# Patient Record
Sex: Female | Born: 1945
Health system: Southern US, Community
[De-identification: ages and names within clinical notes are randomized; demographics above are authoritative.]

## PROBLEM LIST (undated history)

## (undated) DIAGNOSIS — E039 Hypothyroidism, unspecified: Secondary | ICD-10-CM

## (undated) DIAGNOSIS — I10 Essential (primary) hypertension: Secondary | ICD-10-CM

## (undated) DIAGNOSIS — M1990 Unspecified osteoarthritis, unspecified site: Secondary | ICD-10-CM

## (undated) DIAGNOSIS — K219 Gastro-esophageal reflux disease without esophagitis: Secondary | ICD-10-CM

## (undated) HISTORY — PX: FOOT SURGERY: SHX648

## (undated) HISTORY — DX: Essential (primary) hypertension: I10

## (undated) HISTORY — PX: ABDOMINAL HYSTERECTOMY: SHX81

## (undated) HISTORY — PX: BACK SURGERY: SHX140

## (undated) HISTORY — PX: BREAST SURGERY: SHX581

---

## 2007-09-27 ENCOUNTER — Other Ambulatory Visit: Admission: RE | Admit: 2007-09-27 | Discharge: 2007-09-27 | Payer: Self-pay | Admitting: Family Medicine

## 2010-04-19 ENCOUNTER — Encounter: Admission: RE | Admit: 2010-04-19 | Discharge: 2010-04-19 | Payer: Self-pay | Admitting: Family Medicine

## 2010-07-26 ENCOUNTER — Ambulatory Visit (HOSPITAL_BASED_OUTPATIENT_CLINIC_OR_DEPARTMENT_OTHER): Admission: RE | Admit: 2010-07-26 | Discharge: 2010-07-26 | Payer: Self-pay | Admitting: Surgery

## 2010-11-23 LAB — BASIC METABOLIC PANEL
Calcium: 9.2 mg/dL (ref 8.4–10.5)
Creatinine, Ser: 0.99 mg/dL (ref 0.4–1.2)
Glucose, Bld: 105 mg/dL — ABNORMAL HIGH (ref 70–99)

## 2011-04-18 ENCOUNTER — Ambulatory Visit
Admission: RE | Admit: 2011-04-18 | Discharge: 2011-04-18 | Disposition: A | Discharge status: Home / Self Care | Payer: Self-pay | Source: Ambulatory Visit | Attending: Orthopedic Surgery | Admitting: Orthopedic Surgery

## 2011-04-18 ENCOUNTER — Other Ambulatory Visit: Payer: Self-pay | Admitting: Orthopedic Surgery

## 2011-04-18 DIAGNOSIS — M545 Low back pain, unspecified: Secondary | ICD-10-CM

## 2012-04-12 ENCOUNTER — Other Ambulatory Visit (HOSPITAL_BASED_OUTPATIENT_CLINIC_OR_DEPARTMENT_OTHER): Payer: Self-pay | Admitting: Family Medicine

## 2012-04-12 DIAGNOSIS — E041 Nontoxic single thyroid nodule: Secondary | ICD-10-CM

## 2012-04-26 ENCOUNTER — Ambulatory Visit (HOSPITAL_BASED_OUTPATIENT_CLINIC_OR_DEPARTMENT_OTHER)
Admission: RE | Admit: 2012-04-26 | Discharge: 2012-04-26 | Disposition: A | Discharge status: Home / Self Care | Payer: Medicare Other | Source: Ambulatory Visit | Attending: Family Medicine | Admitting: Family Medicine

## 2012-04-26 DIAGNOSIS — E041 Nontoxic single thyroid nodule: Secondary | ICD-10-CM | POA: Insufficient documentation

## 2012-04-30 ENCOUNTER — Other Ambulatory Visit: Payer: Self-pay | Admitting: Physician Assistant

## 2012-04-30 DIAGNOSIS — E042 Nontoxic multinodular goiter: Secondary | ICD-10-CM

## 2012-05-02 ENCOUNTER — Other Ambulatory Visit: Payer: Self-pay | Admitting: Physician Assistant

## 2012-05-02 ENCOUNTER — Other Ambulatory Visit (HOSPITAL_COMMUNITY)
Admission: RE | Admit: 2012-05-02 | Discharge: 2012-05-02 | Disposition: A | Discharge status: Home / Self Care | Payer: Medicare Other | Source: Ambulatory Visit | Attending: Diagnostic Radiology | Admitting: Diagnostic Radiology

## 2012-05-02 ENCOUNTER — Ambulatory Visit
Admission: RE | Admit: 2012-05-02 | Discharge: 2012-05-02 | Disposition: A | Discharge status: Home / Self Care | Payer: Medicare Other | Source: Ambulatory Visit | Attending: Physician Assistant | Admitting: Physician Assistant

## 2012-05-02 DIAGNOSIS — E042 Nontoxic multinodular goiter: Secondary | ICD-10-CM

## 2012-05-02 DIAGNOSIS — E049 Nontoxic goiter, unspecified: Secondary | ICD-10-CM | POA: Insufficient documentation

## 2012-05-21 ENCOUNTER — Encounter (HOSPITAL_COMMUNITY): Payer: Self-pay | Admitting: Pharmacy Technician

## 2012-05-28 ENCOUNTER — Other Ambulatory Visit: Payer: Self-pay | Admitting: Neurosurgery

## 2012-05-28 NOTE — Pre-Procedure Instructions (Signed)
19 Sheila Boyd  05/28/2012   Your procedure is scheduled on:  Monday June 04, 2012 at 0730 AM  Report to [facility] at 0530 AM.  Call this number if you have problems the morning of surgery: [phone]   Remember:   Do not eat food or drink:After Midnight.Sunday      Take these medicines the morning of surgery with A SIP OF WATER: Prevacid[Lansoprazole], Synthroid, and Acetaminophen [ Tylenol] if needed.   Do not wear jewelry, make-up or nail polish.  Do not wear lotions, powders, or perfumes. You may wear deodorant.  Do not shave 48 hours prior to surgery. Men may shave face and neck.  Do not bring valuables to the hospital.  Contacts, dentures or bridgework may not be worn into surgery.  Leave suitcase in the car. After surgery it may be brought to your room.  For patients admitted to the hospital, checkout time is 11:00 AM the day of discharge.   Patients discharged the day of surgery will not be allowed to drive home.    Special Instructions: CHG Shower Use Special Wash: 1/2 bottle night before surgery and 1/2 bottle morning of surgery.   Please read over the following fact sheets that you were given: Pain Booklet, Coughing and Deep Breathing, MRSA Information and Surgical Site Infection Prevention

## 2012-05-29 ENCOUNTER — Encounter (HOSPITAL_COMMUNITY)
Admission: RE | Admit: 2012-05-29 | Discharge: 2012-05-29 | Disposition: A | Discharge status: Home / Self Care | Payer: Medicare Other | Source: Ambulatory Visit | Attending: Neurosurgery | Admitting: Neurosurgery

## 2012-05-29 ENCOUNTER — Encounter (HOSPITAL_COMMUNITY): Payer: Self-pay

## 2012-05-29 HISTORY — DX: Hypothyroidism, unspecified: E03.9

## 2012-05-29 HISTORY — DX: Gastro-esophageal reflux disease without esophagitis: K21.9

## 2012-05-29 HISTORY — DX: Unspecified osteoarthritis, unspecified site: M19.90

## 2012-05-29 LAB — BASIC METABOLIC PANEL
CO2: 31 mEq/L (ref 19–32)
Calcium: 9.9 mg/dL (ref 8.4–10.5)
Chloride: 101 mEq/L (ref 96–112)
Glucose, Bld: 100 mg/dL — ABNORMAL HIGH (ref 70–99)
Sodium: 140 mEq/L (ref 135–145)

## 2012-05-29 LAB — TYPE AND SCREEN: ABO/RH(D): O POS

## 2012-05-29 LAB — CBC
HCT: 39.4 % (ref 36.0–46.0)
RBC: 4.58 MIL/uL (ref 3.87–5.11)
RDW: 14.2 % (ref 11.5–15.5)

## 2012-05-29 LAB — PROTIME-INR: Prothrombin Time: 13.8 seconds (ref 11.6–15.2)

## 2012-05-29 NOTE — Progress Notes (Signed)
Abnormal EKG on preadmission.  Jaynie Collins, Anesthesia PA, notified and given chart to review.

## 2012-05-30 NOTE — Consult Note (Signed)
Anesthesia chart review: Patient is a 66 year old female scheduled for L4-5 posterior lumbar interbody fusion by Dr. Wynetta Emery on 06/11/12 (just rescheduled from 06/04/2012 per office). History includes nonsmoker, HTN on HCTZ, GERD, arthritis, hypothyroidism, hysterectomy, breast and foot surgeries.  BMI 25.  PCP is listed as Johna Sheriff, PA-C [phone]).  Labs noted.  CXR on 05/29/12 showed no active disease. Soft tissue nodular prominence in the upper mediastinum most likely due to multinodular goiter.   EKG on 05/29/12 showed SB, anterolateral T wave abnormality (consider ischemia), inferior T wave abnormality (consider ischemia).  She had prior T wave inversion in V1-4, but the inferior T wave abnormality appears new since her EKG from 07/21/10 (pre-op for excision of right breast mass).  I called and spoke with Ms. Couchman.  She denied known history of CAD/MI/CHF.  She has no history of DM.  She denies history of chest pain or SOB at rest.  She does have dyspnea with moderate activity which is not new.  Her activity has been somewhat limited due to chronic back pain that has now progressed to include leg pain.  She has not been able to do recent strenuous activity due to back and leg pain.  I reviewed with Anesthesiologist Dr. Katrinka Blazing.  If patient remains asymptomatic, then anticipate she can proceed as planned.  Shonna Chock, PA-C

## 2012-05-31 ENCOUNTER — Encounter (HOSPITAL_COMMUNITY): Payer: Self-pay | Admitting: Vascular Surgery

## 2012-06-08 NOTE — Progress Notes (Signed)
NOTIFIED MS. [place] OF TIME CHANGE. INSTRUCTED PATIENT TO ARRIVE AT 1020 AM.

## 2012-06-10 MED ORDER — VANCOMYCIN HCL IN DEXTROSE 1-5 GM/200ML-% IV SOLN
1000.0000 mg | INTRAVENOUS | Status: AC
  Administered 2012-06-11: 1000 mg via INTRAVENOUS

## 2012-06-10 MED ORDER — DEXAMETHASONE SODIUM PHOSPHATE 10 MG/ML IJ SOLN
10.0000 mg | INTRAMUSCULAR | Status: AC
  Administered 2012-06-11: 10 mg via INTRAVENOUS

## 2012-06-11 ENCOUNTER — Inpatient Hospital Stay (HOSPITAL_COMMUNITY)
Admission: RE | Admit: 2012-06-11 | Discharge: 2012-06-14 | DRG: 460 | Disposition: A | Discharge status: Home / Self Care | Payer: Medicare Other | Source: Ambulatory Visit | Attending: Neurosurgery | Admitting: Neurosurgery

## 2012-06-11 ENCOUNTER — Encounter (HOSPITAL_COMMUNITY): Payer: Self-pay | Admitting: Vascular Surgery

## 2012-06-11 ENCOUNTER — Inpatient Hospital Stay (HOSPITAL_COMMUNITY): Payer: Medicare Other | Admitting: Vascular Surgery

## 2012-06-11 ENCOUNTER — Encounter (HOSPITAL_COMMUNITY): Payer: Self-pay | Admitting: *Deleted

## 2012-06-11 ENCOUNTER — Encounter (HOSPITAL_COMMUNITY)
Admission: RE | Disposition: A | Discharge status: Home / Self Care | Payer: Self-pay | Source: Ambulatory Visit | Attending: Neurosurgery

## 2012-06-11 ENCOUNTER — Inpatient Hospital Stay (HOSPITAL_COMMUNITY): Payer: Medicare Other

## 2012-06-11 DIAGNOSIS — I10 Essential (primary) hypertension: Secondary | ICD-10-CM | POA: Diagnosis present

## 2012-06-11 DIAGNOSIS — Z880 Allergy status to penicillin: Secondary | ICD-10-CM

## 2012-06-11 DIAGNOSIS — K219 Gastro-esophageal reflux disease without esophagitis: Secondary | ICD-10-CM | POA: Diagnosis present

## 2012-06-11 DIAGNOSIS — E039 Hypothyroidism, unspecified: Secondary | ICD-10-CM | POA: Diagnosis present

## 2012-06-11 DIAGNOSIS — Z79899 Other long term (current) drug therapy: Secondary | ICD-10-CM

## 2012-06-11 DIAGNOSIS — Q762 Congenital spondylolisthesis: Principal | ICD-10-CM

## 2012-06-11 DIAGNOSIS — Z23 Encounter for immunization: Secondary | ICD-10-CM

## 2012-06-11 DIAGNOSIS — Z01812 Encounter for preprocedural laboratory examination: Secondary | ICD-10-CM

## 2012-06-11 HISTORY — PX: POSTERIOR FUSION LUMBAR SPINE: SUR632

## 2012-06-11 SURGERY — POSTERIOR LUMBAR FUSION 1 LEVEL
Anesthesia: General | Site: Back | Wound class: Clean

## 2012-06-11 MED ORDER — BACITRACIN 50000 UNITS IM SOLR
INTRAMUSCULAR | Status: AC
  Filled 2012-06-11: qty 1

## 2012-06-11 MED ORDER — CYCLOBENZAPRINE HCL 10 MG PO TABS
10.0000 mg | ORAL_TABLET | Freq: Three times a day (TID) | ORAL | Status: DC | PRN
  Administered 2012-06-12 – 2012-06-14 (×2): 10 mg via ORAL
  Filled 2012-06-11 (×2): qty 1

## 2012-06-11 MED ORDER — SODIUM CHLORIDE 0.9 % IV SOLN
INTRAVENOUS | Status: AC
  Filled 2012-06-11: qty 500

## 2012-06-11 MED ORDER — HYDROMORPHONE HCL PF 1 MG/ML IJ SOLN
0.2500 mg | INTRAMUSCULAR | Status: DC | PRN
  Administered 2012-06-11: 0.5 mg via INTRAVENOUS

## 2012-06-11 MED ORDER — LEVOTHYROXINE SODIUM 25 MCG PO TABS
25.0000 ug | ORAL_TABLET | Freq: Every day | ORAL | Status: DC
  Administered 2012-06-12 – 2012-06-14 (×3): 25 ug via ORAL
  Filled 2012-06-11 (×3): qty 1

## 2012-06-11 MED ORDER — HYDROMORPHONE HCL PF 1 MG/ML IJ SOLN
INTRAMUSCULAR | Status: AC
  Filled 2012-06-11: qty 1

## 2012-06-11 MED ORDER — SODIUM CHLORIDE 0.9 % IR SOLN
Status: DC | PRN
  Administered 2012-06-11: 13:00:00

## 2012-06-11 MED ORDER — HEPARIN SODIUM (PORCINE) 1000 UNIT/ML IJ SOLN
INTRAMUSCULAR | Status: AC
  Filled 2012-06-11: qty 1

## 2012-06-11 MED ORDER — ACETAMINOPHEN 325 MG PO TABS
650.0000 mg | ORAL_TABLET | ORAL | Status: DC | PRN
  Administered 2012-06-13 – 2012-06-14 (×3): 650 mg via ORAL
  Filled 2012-06-11 (×3): qty 2

## 2012-06-11 MED ORDER — ROCURONIUM BROMIDE 100 MG/10ML IV SOLN
INTRAVENOUS | Status: DC | PRN
  Administered 2012-06-11: 50 mg via INTRAVENOUS
  Administered 2012-06-11 (×2): 10 mg via INTRAVENOUS
  Administered 2012-06-11: 5 mg via INTRAVENOUS

## 2012-06-11 MED ORDER — VANCOMYCIN HCL IN DEXTROSE 1-5 GM/200ML-% IV SOLN
1000.0000 mg | Freq: Two times a day (BID) | INTRAVENOUS | Status: AC
  Administered 2012-06-11 – 2012-06-12 (×2): 1000 mg via INTRAVENOUS
  Filled 2012-06-11 (×2): qty 200

## 2012-06-11 MED ORDER — MIDAZOLAM HCL 5 MG/5ML IJ SOLN
INTRAMUSCULAR | Status: DC | PRN
  Administered 2012-06-11 (×2): 1 mg via INTRAVENOUS

## 2012-06-11 MED ORDER — SODIUM CHLORIDE 0.9 % IJ SOLN: 3.0000 mL | INTRAMUSCULAR | Status: DC | PRN

## 2012-06-11 MED ORDER — THROMBIN 20000 UNITS EX SOLR
CUTANEOUS | Status: DC | PRN
  Administered 2012-06-11: 13:00:00 via TOPICAL

## 2012-06-11 MED ORDER — NEOSTIGMINE METHYLSULFATE 1 MG/ML IJ SOLN
INTRAMUSCULAR | Status: DC | PRN
  Administered 2012-06-11: 4 mg via INTRAVENOUS

## 2012-06-11 MED ORDER — SODIUM CHLORIDE 0.9 % IV SOLN
INTRAVENOUS | Status: DC | PRN
  Administered 2012-06-11: 15:00:00 via INTRAVENOUS

## 2012-06-11 MED ORDER — LIDOCAINE HCL (CARDIAC) 20 MG/ML IV SOLN
INTRAVENOUS | Status: DC | PRN
  Administered 2012-06-11: 40 mg via INTRAVENOUS

## 2012-06-11 MED ORDER — PROPOFOL 10 MG/ML IV BOLUS
INTRAVENOUS | Status: DC | PRN
  Administered 2012-06-11: 50 mg via INTRAVENOUS
  Administered 2012-06-11: 150 mg via INTRAVENOUS

## 2012-06-11 MED ORDER — PHENOL 1.4 % MT LIQD: 1.0000 | OROMUCOSAL | Status: DC | PRN

## 2012-06-11 MED ORDER — LACTATED RINGERS IV SOLN
INTRAVENOUS | Status: DC | PRN
  Administered 2012-06-11 (×2): via INTRAVENOUS

## 2012-06-11 MED ORDER — MEPERIDINE HCL 25 MG/ML IJ SOLN: 6.2500 mg | INTRAMUSCULAR | Status: DC | PRN

## 2012-06-11 MED ORDER — ONDANSETRON HCL 4 MG/2ML IJ SOLN
4.0000 mg | INTRAMUSCULAR | Status: DC | PRN
  Administered 2012-06-12: 4 mg via INTRAVENOUS
  Filled 2012-06-11: qty 2

## 2012-06-11 MED ORDER — ONDANSETRON HCL 4 MG/2ML IJ SOLN
INTRAMUSCULAR | Status: DC | PRN
  Administered 2012-06-11: 4 mg via INTRAVENOUS

## 2012-06-11 MED ORDER — HYDROCHLOROTHIAZIDE 25 MG PO TABS
12.5000 mg | ORAL_TABLET | Freq: Every day | ORAL | Status: DC
  Administered 2012-06-11 – 2012-06-12 (×2): 12.5 mg via ORAL
  Filled 2012-06-11 (×3): qty 0.5

## 2012-06-11 MED ORDER — FENTANYL CITRATE 0.05 MG/ML IJ SOLN
INTRAMUSCULAR | Status: DC | PRN
  Administered 2012-06-11 (×4): 50 ug via INTRAVENOUS
  Administered 2012-06-11: 100 ug via INTRAVENOUS
  Administered 2012-06-11 (×4): 50 ug via INTRAVENOUS

## 2012-06-11 MED ORDER — MIDAZOLAM HCL 2 MG/2ML IJ SOLN: 0.5000 mg | Freq: Once | INTRAMUSCULAR | Status: DC | PRN

## 2012-06-11 MED ORDER — GLYCOPYRROLATE 0.2 MG/ML IJ SOLN
INTRAMUSCULAR | Status: DC | PRN
  Administered 2012-06-11: 0.6 mg via INTRAVENOUS

## 2012-06-11 MED ORDER — PROMETHAZINE HCL 25 MG/ML IJ SOLN: 6.2500 mg | INTRAMUSCULAR | Status: DC | PRN

## 2012-06-11 MED ORDER — SODIUM CHLORIDE 0.9 % IV SOLN
250.0000 mL | INTRAVENOUS | Status: DC
  Administered 2012-06-11: 250 mL via INTRAVENOUS

## 2012-06-11 MED ORDER — BUPIVACAINE HCL (PF) 0.25 % IJ SOLN
INTRAMUSCULAR | Status: DC | PRN
  Administered 2012-06-11: 9 mL

## 2012-06-11 MED ORDER — MENTHOL 3 MG MT LOZG: 1.0000 | LOZENGE | OROMUCOSAL | Status: DC | PRN

## 2012-06-11 MED ORDER — THROMBIN 5000 UNITS EX SOLR
OROMUCOSAL | Status: DC | PRN
  Administered 2012-06-11: 15:00:00 via TOPICAL

## 2012-06-11 MED ORDER — PANTOPRAZOLE SODIUM 20 MG PO TBEC
20.0000 mg | DELAYED_RELEASE_TABLET | Freq: Every day | ORAL | Status: DC
  Administered 2012-06-12 – 2012-06-14 (×3): 20 mg via ORAL
  Filled 2012-06-11 (×3): qty 1

## 2012-06-11 MED ORDER — HYDROMORPHONE HCL PF 1 MG/ML IJ SOLN
0.5000 mg | INTRAMUSCULAR | Status: DC | PRN
  Administered 2012-06-11 – 2012-06-12 (×4): 1 mg via INTRAVENOUS
  Filled 2012-06-11 (×4): qty 1

## 2012-06-11 MED ORDER — 0.9 % SODIUM CHLORIDE (POUR BTL) OPTIME
TOPICAL | Status: DC | PRN
  Administered 2012-06-11: 1000 mL

## 2012-06-11 MED ORDER — LIDOCAINE-EPINEPHRINE 1 %-1:100000 IJ SOLN
INTRAMUSCULAR | Status: DC | PRN
  Administered 2012-06-11: 7 mL

## 2012-06-11 MED ORDER — LACTATED RINGERS IV SOLN
INTRAVENOUS | Status: DC | PRN
  Administered 2012-06-11 (×2): via INTRAVENOUS

## 2012-06-11 MED ORDER — EPHEDRINE SULFATE 50 MG/ML IJ SOLN
INTRAMUSCULAR | Status: DC | PRN
  Administered 2012-06-11 (×5): 10 mg via INTRAVENOUS

## 2012-06-11 MED ORDER — INFLUENZA VIRUS VACC SPLIT PF IM SUSP
0.5000 mL | INTRAMUSCULAR | Status: AC
  Administered 2012-06-12: 0.5 mL via INTRAMUSCULAR
  Filled 2012-06-11: qty 0.5

## 2012-06-11 MED ORDER — ALUM & MAG HYDROXIDE-SIMETH 200-200-20 MG/5ML PO SUSP: 30.0000 mL | Freq: Four times a day (QID) | ORAL | Status: DC | PRN

## 2012-06-11 MED ORDER — SODIUM CHLORIDE 0.9 % IJ SOLN: 3.0000 mL | Freq: Two times a day (BID) | INTRAMUSCULAR | Status: DC

## 2012-06-11 MED ORDER — DEXAMETHASONE SODIUM PHOSPHATE 10 MG/ML IJ SOLN
INTRAMUSCULAR | Status: AC
  Filled 2012-06-11: qty 1

## 2012-06-11 MED ORDER — CEFAZOLIN SODIUM 1-5 GM-% IV SOLN
1.0000 g | Freq: Three times a day (TID) | INTRAVENOUS | Status: DC
  Filled 2012-06-11 (×2): qty 50

## 2012-06-11 MED ORDER — ACETAMINOPHEN 650 MG RE SUPP: 650.0000 mg | RECTAL | Status: DC | PRN

## 2012-06-11 MED ORDER — ACETAMINOPHEN 10 MG/ML IV SOLN
INTRAVENOUS | Status: AC
  Administered 2012-06-11: 1000 mg via INTRAVENOUS
  Filled 2012-06-11: qty 100

## 2012-06-11 MED ORDER — DEXTROSE 5 % IV SOLN
INTRAVENOUS | Status: DC | PRN
  Administered 2012-06-11: 12:00:00 via INTRAVENOUS

## 2012-06-11 SURGICAL SUPPLY — 77 items
BAG DECANTER FOR FLEXI CONT (MISCELLANEOUS) ×2 IMPLANT
BENZOIN TINCTURE PRP APPL 2/3 (GAUZE/BANDAGES/DRESSINGS) ×2 IMPLANT
BLADE SURG 11 STRL SS (BLADE) ×2 IMPLANT
BLADE SURG ROTATE 9660 (MISCELLANEOUS) IMPLANT
BRUSH SCRUB EZ PLAIN DRY (MISCELLANEOUS) ×2 IMPLANT
BUR MATCHSTICK NEURO 3.0 LAGG (BURR) ×2 IMPLANT
BUR PRECISION FLUTE 6.0 (BURR) ×2 IMPLANT
CANISTER SUCTION 2500CC (MISCELLANEOUS) ×2 IMPLANT
CAP LOCKING REVERE (Cap) ×8 IMPLANT
CLOTH BEACON ORANGE TIMEOUT ST (SAFETY) ×2 IMPLANT
CONT SPEC 4OZ CLIKSEAL STRL BL (MISCELLANEOUS) ×4 IMPLANT
COVER BACK TABLE 24X17X13 BIG (DRAPES) IMPLANT
COVER TABLE BACK 60X90 (DRAPES) ×2 IMPLANT
DECANTER SPIKE VIAL GLASS SM (MISCELLANEOUS) IMPLANT
DERMABOND ADHESIVE PROPEN (GAUZE/BANDAGES/DRESSINGS) ×1
DERMABOND ADVANCED (GAUZE/BANDAGES/DRESSINGS) ×1
DERMABOND ADVANCED .7 DNX12 (GAUZE/BANDAGES/DRESSINGS) ×1 IMPLANT
DERMABOND ADVANCED .7 DNX6 (GAUZE/BANDAGES/DRESSINGS) ×1 IMPLANT
DRAPE C-ARM 42X72 X-RAY (DRAPES) ×4 IMPLANT
DRAPE LAPAROTOMY 100X72X124 (DRAPES) ×2 IMPLANT
DRAPE POUCH INSTRU U-SHP 10X18 (DRAPES) ×2 IMPLANT
DRAPE PROXIMA HALF (DRAPES) IMPLANT
DRAPE SURG 17X23 STRL (DRAPES) ×2 IMPLANT
DRSG OPSITE 4X5.5 SM (GAUZE/BANDAGES/DRESSINGS) ×2 IMPLANT
ELECT REM PT RETURN 9FT ADLT (ELECTROSURGICAL) ×2
ELECTRODE REM PT RTRN 9FT ADLT (ELECTROSURGICAL) ×1 IMPLANT
EVACUATOR 3/16  PVC DRAIN (DRAIN) ×1
EVACUATOR 3/16 PVC DRAIN (DRAIN) ×1 IMPLANT
GAUZE SPONGE 4X4 16PLY XRAY LF (GAUZE/BANDAGES/DRESSINGS) IMPLANT
GLOVE BIO SURGEON STRL SZ 6.5 (GLOVE) ×2 IMPLANT
GLOVE BIO SURGEON STRL SZ8 (GLOVE) ×4 IMPLANT
GLOVE BIOGEL PI IND STRL 6.5 (GLOVE) ×2 IMPLANT
GLOVE BIOGEL PI IND STRL 7.0 (GLOVE) ×2 IMPLANT
GLOVE BIOGEL PI IND STRL 7.5 (GLOVE) ×1 IMPLANT
GLOVE BIOGEL PI INDICATOR 6.5 (GLOVE) ×2
GLOVE BIOGEL PI INDICATOR 7.0 (GLOVE) ×2
GLOVE BIOGEL PI INDICATOR 7.5 (GLOVE) ×1
GLOVE ECLIPSE 7.5 STRL STRAW (GLOVE) IMPLANT
GLOVE ECLIPSE 8.5 STRL (GLOVE) ×2 IMPLANT
GLOVE EXAM NITRILE LRG STRL (GLOVE) IMPLANT
GLOVE EXAM NITRILE MD LF STRL (GLOVE) IMPLANT
GLOVE EXAM NITRILE XL STR (GLOVE) IMPLANT
GLOVE EXAM NITRILE XS STR PU (GLOVE) IMPLANT
GLOVE INDICATOR 8.5 STRL (GLOVE) ×4 IMPLANT
GLOVE SS BIOGEL STRL SZ 6.5 (GLOVE) ×3 IMPLANT
GLOVE SUPERSENSE BIOGEL SZ 6.5 (GLOVE) ×3
GOWN BRE IMP SLV AUR LG STRL (GOWN DISPOSABLE) ×4 IMPLANT
GOWN BRE IMP SLV AUR XL STRL (GOWN DISPOSABLE) ×6 IMPLANT
GOWN STRL REIN 2XL LVL4 (GOWN DISPOSABLE) IMPLANT
KIT BASIN OR (CUSTOM PROCEDURE TRAY) ×2 IMPLANT
KIT ROOM TURNOVER OR (KITS) ×2 IMPLANT
MILL MEDIUM DISP (BLADE) ×2 IMPLANT
NEEDLE HYPO 25X1 1.5 SAFETY (NEEDLE) ×2 IMPLANT
NS IRRIG 1000ML POUR BTL (IV SOLUTION) ×2 IMPLANT
PACK LAMINECTOMY NEURO (CUSTOM PROCEDURE TRAY) ×2 IMPLANT
PAD ARMBOARD 7.5X6 YLW CONV (MISCELLANEOUS) ×8 IMPLANT
PATTIES SURGICAL 1X1 (DISPOSABLE) ×2 IMPLANT
PUTTY BONE DBX 5CC MIX (Putty) ×2 IMPLANT
ROD CURVED 5.5X45MM (Rod) ×4 IMPLANT
SCREW PEDICLE 6.5MMX45MM (Screw) ×4 IMPLANT
SCREW PEDICLE 6.5X40MM (Screw) ×4 IMPLANT
SCREW PEDICLE 6.5X45 (Screw) ×2 IMPLANT
SPACER CALIBER 10X22MM 11-15MM (Spacer) ×4 IMPLANT
SPONGE GAUZE 4X4 12PLY (GAUZE/BANDAGES/DRESSINGS) ×2 IMPLANT
SPONGE LAP 4X18 X RAY DECT (DISPOSABLE) IMPLANT
SPONGE SURGIFOAM ABS GEL 100 (HEMOSTASIS) ×2 IMPLANT
STRIP CLOSURE SKIN 1/2X4 (GAUZE/BANDAGES/DRESSINGS) ×2 IMPLANT
SUT VIC AB 0 CT1 18XCR BRD8 (SUTURE) ×2 IMPLANT
SUT VIC AB 0 CT1 8-18 (SUTURE) ×2
SUT VIC AB 2-0 CT1 18 (SUTURE) ×2 IMPLANT
SUT VICRYL 4-0 PS2 18IN ABS (SUTURE) ×2 IMPLANT
SYR 20ML ECCENTRIC (SYRINGE) ×2 IMPLANT
T CONNECTOR ADJ 48MM-61MM (Connector) ×2 IMPLANT
TOWEL OR 17X24 6PK STRL BLUE (TOWEL DISPOSABLE) ×2 IMPLANT
TOWEL OR 17X26 10 PK STRL BLUE (TOWEL DISPOSABLE) ×2 IMPLANT
TRAY FOLEY CATH 14FRSI W/METER (CATHETERS) ×2 IMPLANT
WATER STERILE IRR 1000ML POUR (IV SOLUTION) ×2 IMPLANT

## 2012-06-11 NOTE — Anesthesia Preprocedure Evaluation (Addendum)
Anesthesia Evaluation  Patient identified by MRN, date of birth, ID band Patient awake    Reviewed: Allergy & Precautions, H&P , NPO status , Patient's Chart, lab work & pertinent test results  History of Anesthesia Complications Negative for: history of anesthetic complications  Airway Mallampati: I TM Distance: >3 FB Neck ROM: Full    Dental  (+) Missing and Dental Advisory Given   Pulmonary neg pulmonary ROS,  breath sounds clear to auscultation  Pulmonary exam normal       Cardiovascular hypertension, Pt. on medications Rhythm:Regular Rate:Normal     Neuro/Psych Chronic back pain: tylenol daily negative psych ROS   GI/Hepatic Neg liver ROS, GERD-  Medicated and Controlled,  Endo/Other  Hypothyroidism (on replacement)   Renal/GU negative Renal ROS     Musculoskeletal   Abdominal   Peds  Hematology   Anesthesia Other Findings   Reproductive/Obstetrics                           Anesthesia Physical Anesthesia Plan  ASA: II  Anesthesia Plan: General   Post-op Pain Management:    Induction: Intravenous  Airway Management Planned: Oral ETT  Additional Equipment:   Intra-op Plan:   Post-operative Plan: Extubation in OR  Informed Consent: I have reviewed the patients History and Physical, chart, labs and discussed the procedure including the risks, benefits and alternatives for the proposed anesthesia with the patient or authorized representative who has indicated his/her understanding and acceptance.   Dental advisory given  Plan Discussed with: CRNA and Surgeon  Anesthesia Plan Comments: (Plan routine monitors, GETA)        Anesthesia Quick Evaluation

## 2012-06-11 NOTE — Op Note (Signed)
Operative diagnosis: Grade 1 spondylolisthesis L4-5 with spondylolisthesis bilateral L4 and L5 radiculopathies was severe foraminal stenosis of the L4 and L5 nerve roots as well central stenosis at L4-5  Postoperative diagnosis: Same  Procedure: Decompressive lumbar laminectomy L4-5 in excess of what would be needed with a standard interbody fusion  #2 posterior lumbar interbody fusion using a caliper expandable peek cages packed with local are graft mixed DBX  #3 pedicle screw fixation L4-5 using the 5.5 globus Revere pedicle screw system  #4 posterior lateral arthrodesis L4-5 using locally harvested autograft mixed with DBX  #5 placement of large Hemovac drain  Surgeon: Jillyn Hidden Kira Hartl  Assistant: Sherilyn Cooter pool  Anesthesia: Gen.  EBL: 600 with 200 Cell Saver given back  History of present illness: Patient reports exam year female presents with back and bilateral leg pain with pain that radiated commonly in her right leg but also her left eye and L4 and L5 nerve root pattern workup showed a dynamic spondylolisthesis with severe foraminal stenosis at L4-5 marked facet arthropathy and instability in the facet joints and bifrontal stenosis of the L4 and L5 nerve roots. Due to patient's progression of clinical syndrome and imaging findings and failure conservative treatment she was recommended decompression stabilization procedure extensor reviewed the risks and benefits of the operation with her as well as perioperative course expectations of outcome alternatives of surgery she understands and agrees to proceed forward.  Operative procedure: Patient was brought into the or was induced under general anesthesia positioned prone the Wilson frame her back was prepped and draped in routine sterile fashion. Preoperative x-ray localize the appropriate levels after infiltration of 10 cc lidocaine with epi a midline incision the bullet car was used to take down the subcutaneous tissues subperiosteal dissection  was carried out on the lamina of L4 and L5 exposing the TPS L4 and L5 immediately upon exposure upon dissected to the fascia marked facet arthropathy extra canalicular synovial cyst were identified with ligamentous disruption of the L4-5 interspinous we will was immediately visualized as well. The spinous process of L4 was removed central decompression was begun there was marked as this is the facet joints these were disarticulated and medial facetectomies were performed bilaterally with radical foraminotomies of the L4 and L5 nerve root was extensive amount of epidural fibrosis from the instability this was teased off of the dura and after adequate foraminotomies been achieved and adequate abutting the superior tickling facet complexes to gain adequate exposure to lateral disc space attention was first taken to pedicle screw placement. Using a high-speed drill pilot hole was drilled L4 and the right cannulated with the awl probed O55 Probed again and a 6.5/45 screws L. inserted L4 and the right. Fluoroscopy was used the step along the way to confirm trajectory as well as bony landmarks were used to confirm no mediolateral breech. In a similar fashion 6.5x40 screws inserted at L5 on the right and a 6.5 x 45 screw inserted on the left at L4 and 6.5 x 40 at L5.*All screws in place is to the interbody work distractors were inserted on the patient's right side initially a size 12 distractor was placed the second apposition the endplates the disc spaces cleanout radically to the left side scraped the endplates with a rotating cutter and Epstein curettes. After adequate disc material but removed and the endplates were adequately prepped and 11 expandable cage was inserted the patient's left side this is expander 5 turns up to approximately 13 mm second apposition the endplates  the distractor was removed fluoroscopy was used the step along the way. Then in a similar fashion the right-sided disc was removed as well central  disc there was a large central fragments are removed local autograft DBX was packed centrally and the right-sided cage was placed in a similar fashion. Posterior fluoroscopy confirmed good position a cages implant. Then the wound scope was irrigated meticulous hemostasis was maintained aggressive decortication was carried TPS lateral gutters the remainder of the locally harvested our graft pack posterior laterally 45 mm rods were selected top tightening nuts were tightened down L5 the L4 screw was compressed against L5 and across it was placed the wounds and closed with after placement of large Hemovac drain with interrupted Vicryl and a running 4 septic or at the end of case on it counts sponge counts were correct.

## 2012-06-11 NOTE — Transfer of Care (Signed)
Immediate Anesthesia Transfer of Care Note  Patient: Sheila Boyd  Procedure(s) Performed: Procedure(s) (LRB) with comments: POSTERIOR LUMBAR FUSION 1 LEVEL (N/A) - Lumbar four-five posterior lumbar interbody fusion   Patient Location: PACU  Anesthesia Type: General  Level of Consciousness: awake and sedated  Airway & Oxygen Therapy: Patient Spontanous Breathing and Patient connected to face mask oxygen  Post-op Assessment: Report given to PACU RN, Post -op Vital signs reviewed and stable and Patient moving all extremities  Post vital signs: Reviewed and stable  Complications: No apparent anesthesia complications

## 2012-06-11 NOTE — H&P (Signed)
Sheila Boyd is an 66 y.o. female.   Chief Complaint: Back and right greater left leg pain HPI: Patient is very pleasant 66 year female is a long-standing back and bilateral leg pain worse on the right with abrasion the back of her leg from her shin adjacent of her foot and big toe consistent L4 and L5 nerve root pattern patient's failed all forms of conservative treatment anti-inflammatories physical therapy and steroids and imaging findings revealed grade 1 spinal listhesis was severe stenosis at L4 and L5 nerve root and due to failure conservative treatment imaging findings progression of clinical syndrome we have recommended decompression stabilization procedure. One of the risks benefits of the operation with her she understands and agrees to proceed forward.  Past Medical History  Diagnosis Date  . GERD (gastroesophageal reflux disease)   . Hypothyroidism   . Arthritis   . Hypertension     Past Surgical History  Procedure Date  . Abdominal hysterectomy   . Foot surgery     right foot  -- pin placed  . Breast surgery     benign  lumps removed    History reviewed. No pertinent family history. Social History:  reports that she has never smoked. She does not have any smokeless tobacco history on file. She reports that she does not drink alcohol or use illicit drugs.  Allergies:  Allergies  Allergen Reactions  . Penicillins Other (See Comments)    Unknown reaction.    Medications Prior to Admission  Medication Sig Dispense Refill  . acetaminophen (TYLENOL) 500 MG tablet Take 1,000 mg by mouth every 6 (six) hours as needed. pain      . hydrochlorothiazide (HYDRODIURIL) 12.5 MG tablet Take 12.5 mg by mouth daily.      . lansoprazole (PREVACID) 15 MG capsule Take 15 mg by mouth daily.      Marland Kitchen levothyroxine (SYNTHROID, LEVOTHROID) 25 MCG tablet Take 25 mcg by mouth daily.        No results found for this or any previous visit (from the past 48 hour(s)). No results  found.  Review of Systems  Constitutional: Negative.   Eyes: Negative.   Respiratory: Negative.   Cardiovascular: Negative.   Gastrointestinal: Negative.   Genitourinary: Negative.   Musculoskeletal: Positive for myalgias, back pain and joint pain.  Skin: Negative.   Neurological: Positive for tingling.    Blood pressure 173/84, pulse 69, temperature 98 F (36.7 C), temperature source Oral, resp. rate 18, SpO2 100.00%. Physical Exam  Constitutional: [name] oriented to person, place, and time. She appears well-developed and well-nourished.  HENT:  Head: Normocephalic.  Eyes: Pupils are equal, round, and reactive to light.  Neck: Normal range of motion.  Respiratory: Effort normal and breath sounds normal.  GI: Soft.  Neurological: [name] alert and oriented to person, place, and time. She has normal strength. GCS eye subscore is 4. GCS verbal subscore is 5. GCS motor subscore is 6.  Reflex Scores:      Patellar reflexes are 0 on the right side and 0 on the left side.      Achilles reflexes are 0 on the right side and 0 on the left side.      Strength is 5 out of 5 in her iliopsoas, quads, hip she's, gaseous, anterior tibialis, and EHL.     Assessment/Plan 66 year female presents for posterior lumbar interbody fusion L4-5 with extensor reviewed the risks benefits of the operation as well as therapy course expectations  about alternatives surgery and she understands and agrees to proceed forward.  Sonda Coppens P 06/11/2012, 11:42 AM

## 2012-06-11 NOTE — Progress Notes (Signed)
Orthopedic Tech Progress Note Patient Details:  Sheila Boyd [birth year] [id number]  Patient ID: [name], female   DOB: March 09, 1946, 66 y.o.   MRN: [id number]   [name] 06/11/2012, 8:53 AM LS SUPPORT WITH ANT AND POST PANELS COMPLETED BY BIO TECH

## 2012-06-11 NOTE — Anesthesia Postprocedure Evaluation (Signed)
  Anesthesia Post-op Note  Patient: Sheila Boyd  Procedure(s) Performed: Procedure(s) (LRB) with comments: POSTERIOR LUMBAR FUSION 1 LEVEL (N/A) - Lumbar four-five posterior lumbar interbody fusion   Patient Location: PACU  Anesthesia Type: General  Level of Consciousness: sedated, patient cooperative and responds to stimulation and voice  Airway and Oxygen Therapy: Patient Spontanous Breathing and Patient connected to nasal cannula oxygen  Post-op Pain: none  Post-op Assessment: Post-op Vital signs reviewed, Patient's Cardiovascular Status Stable, Respiratory Function Stable, Patent Airway, No signs of Nausea or vomiting and Pain level controlled  Post-op Vital Signs: Reviewed and stable  Complications: No apparent anesthesia complications

## 2012-06-12 ENCOUNTER — Encounter (HOSPITAL_COMMUNITY): Payer: Self-pay | Admitting: General Practice

## 2012-06-12 NOTE — Progress Notes (Signed)
Pt currently with home medication in room. Pt attempting to take home medications for AM pills with OT in room. OT called RN Riley Lam to room to discuss with patient that medications are currently being given by RN staff and no home medications are needed. Pt states "oh okay that's fine" s/p education. Pt's medications shown to [employer]   I agree with the following treatment note after reviewing documentation.   Harrel Carina [place]way   OTR/L Pager: [phone] Office: [phone] .

## 2012-06-12 NOTE — Progress Notes (Signed)
Occupational Therapy Evaluation Patient Details Name: Sheila Boyd MRN: [id number] DOB: 1946/04/14 Today's Date: 06/12/2012 Time: 7829-5621 OT Time Calculation (min): 34 min  OT Assessment / Plan / Recommendation Clinical Impression  Pt. 66 yo female s/p psterior lumbar fusioin of L4-L5. Pt. educated on back precautions and able to recall 2/3 at the end of tx session. Pt. limited by dizziness this session requesting to get back in bed after brushing her teeth at the sink. Pt. would benefit from OT acutely to maximize independence with ADL's.     OT Assessment  Patient needs continued OT Services    Follow Up Recommendations  Supervision/Assistance - 24 hour    Barriers to Discharge      Equipment Recommendations  None recommended by OT    Recommendations for Other Services    Frequency  Min 2X/week    Precautions / Restrictions Precautions Precautions: Back Precaution Booklet Issued: Yes (comment) Precaution Comments: pt. able to recall 2/3 precautions Required Braces or Orthoses: Spinal Brace   Pertinent Vitals/Pain No pain only slight discomfort    ADL  Eating/Feeding: Simulated;Set up Where Assessed - Eating/Feeding: Bed level Grooming: Performed;Wash/dry hands;Teeth care;Supervision/safety Where Assessed - Grooming: Supported standing Toilet Transfer: Simulated;Min Pension scheme manager Method: Sit to Barista: Raised toilet seat with arms (or 3-in-1 over toilet) Toileting - Clothing Manipulation and Hygiene: Simulated;Supervision/safety Where Assessed - Toileting Clothing Manipulation and Hygiene: Sit to stand from 3-in-1 or toilet Equipment Used: Back brace;Rolling walker;Gait belt Transfers/Ambulation Related to ADLs: Pt. requires extra time for sit<>stand and stand<>sit transfers as well as ambulating to the bathroom due to feeling dizzy  ADL Comments: Pt. comlpeted grooming tasks at sink level using RW for support. Pt. able to  verbalize which precautions she could break while brushing teeth. Session limited due to pt. feeling dizzy and requesting to get back into bed. Will address LB bathing and dressing in next session     OT Diagnosis: Generalized weakness  OT Problem List: Decreased activity tolerance;Decreased knowledge of use of DME or AE;Decreased knowledge of precautions OT Treatment Interventions: Self-care/ADL training;Therapeutic exercise;DME and/or AE instruction;Patient/family education   OT Goals Acute Rehab OT Goals OT Goal Formulation: With patient Time For Goal Achievement: 06/26/12 Potential to Achieve Goals: Good ADL Goals Pt Will Perform Lower Body Dressing: with supervision;Sit to stand from bed ADL Goal: Lower Body Dressing - Progress: Goal set today Pt Will Transfer to Toilet: with supervision;Ambulation;Regular height toilet ADL Goal: Toilet Transfer - Progress: Goal set today Pt Will Perform Toileting - Clothing Manipulation: with supervision;Sitting on 3-in-1 or toilet ADL Goal: Toileting - Clothing Manipulation - Progress: Goal set today Pt Will Perform Toileting - Hygiene: with supervision;Sit to stand from 3-in-1/toilet ADL Goal: Toileting - Hygiene - Progress: Goal set today Miscellaneous OT Goals Miscellaneous OT Goal #1: Pt. will recall 3/3 back precautions in order to increase independence with ADL's  OT Goal: Miscellaneous Goal #1 - Progress: Goal set today  Visit Information  Last OT Received On: 06/12/12 Assistance Needed: +1    Subjective Data  Subjective: I am feeling dizzy  Patient Stated Goal: None stated   Prior Functioning     Home Living Lives With: Daughter Available Help at Discharge: Family Type of Home: House Home Access: Stairs to enter (from the garage) Secretary/administrator of Steps: 3 Home Layout: Two level;Able to live on main level with bedroom/bathroom Bathroom Shower/Tub: Health visitor: Standard Home Adaptive Equipment:  None Prior Function Level of Independence: Independent  Able to Take Stairs?: Yes Driving: Yes Vocation: Unemployed Communication Communication: No difficulties Dominant Hand: Right         Vision/Perception     Cognition  Overall Cognitive Status: Appears within functional limits for tasks assessed/performed Arousal/Alertness: Awake/alert Orientation Level: Oriented X4 / Intact Behavior During Session: WFL for tasks performed    Extremity/Trunk Assessment Right Upper Extremity Assessment RUE ROM/Strength/Tone: [facility] for tasks assessed Left Upper Extremity Assessment LUE ROM/Strength/Tone: WFL for tasks assessed Trunk Assessment Trunk Assessment: Normal     Mobility Bed Mobility Bed Mobility: Rolling Left;Left Sidelying to Sit;Sitting - Scoot to Edge of Bed;Sit to Sidelying Left Rolling Left: 6: Modified independent (Device/Increase time);With rail Left Sidelying to Sit: 4: Min guard;With rails Sitting - Scoot to Edge of Bed: 6: Modified independent (Device/Increase time) Sit to Sidelying Left: 5: Supervision;With rail Details for Bed Mobility Assistance: Pt. required extra time to for all bed mobility due to feeling dizzy. Pt. educated on log roll and required verbal cues to complete.  Transfers Transfers: Stand to Sit;Sit to Stand Sit to Stand: 5: Supervision;From bed;With upper extremity assist Stand to Sit: 5: Supervision;With upper extremity assist;To bed Details for Transfer Assistance: supervision for safety                     End of Session OT - End of Session Equipment Utilized During Treatment: Gait belt Activity Tolerance: Other (comment) (Pt. limited due to feeling very dizzy ) Patient left: in bed;with call bell/phone within reach;with nursing in room Nurse Communication: Mobility status;Precautions  GO     Cleora Fleet 06/12/2012, 9:12 AM

## 2012-06-12 NOTE — Evaluation (Addendum)
Physical Therapy Evaluation Patient Details Name: Sheila Boyd MRN: [id number] DOB: 1946/07/18 Today's Date: 06/12/2012 Time: 8295-6213 PT Time Calculation (min): 22 min  PT Assessment / Plan / Recommendation Clinical Impression  Sheila Boyd is 66 y/o female s/p PLIF L4-5 POD #1. Presents to PT today with limited mobility secondary to pain. Will benefit physical therapy in the acutes setting to maxmize functional independence and mobility prior to d/c. Likely no f/u PT needed however would like patient to reach modified independent level prior to d/c as she has limited support at home. Will follow acutely.     PT Assessment  Patient needs continued PT services    Follow Up Recommendations  No PT follow up    Barriers to Discharge Decreased caregiver support family support prn only and she has a 60 y/o daughter at home?? howver she has family members who will help take her to school    Equipment Recommendations  Rolling walker with 5" wheels    Recommendations for Other Services     Frequency Min 5X/week    Precautions / Restrictions Precautions Precautions: Back Precaution Booklet Issued: Yes (comment) Precaution Comments: Pt able to state 2/3 back precautions. Required Braces or Orthoses: Spinal Brace Spinal Brace: Lumbar corset;Applied in sitting position         Mobility  Bed Mobility Bed Mobility: Rolling Left;Left Sidelying to Sit;Sitting - Scoot to Edge of Bed;Sit to Sidelying Left Rolling Left: 4: Min assist;With rail Left Sidelying to Sit: 4: Min guard;With rails;HOB flat Sitting - Scoot to Edge of Bed: 5: Supervision Sit to Sidelying Left: 5: Supervision;With rail Details for Bed Mobility Assistance: verbal cues for sequencing, min facilitation to bring hips closer to the edge of bed prior to sidelying->sit using bed pad Transfers Transfers: Stand to Sit;Sit to Stand Sit to Stand: 4: Min guard;With upper extremity assist;From bed Stand to Sit: With upper  extremity assist;5: Supervision;With armrests;To chair/3-in-1 Details for Transfer Assistance: verbal cues for safe technique and hand placement specifically with RW, slow to rise secondary to pain warranting mingaurdA  Ambulation/Gait Ambulation/Gait Assistance: 4: Min guard Ambulation Distance (Feet): 200 Feet Assistive device: Rolling walker Ambulation/Gait Assistance Details: cues for safe technique with RW and tall posture Gait Pattern: Step-through pattern;Trunk flexed Gait velocity: appropriately slowed    Shoulder Instructions     Exercises     PT Diagnosis: Difficulty walking;Acute pain  PT Problem List: Decreased activity tolerance;Decreased mobility;Pain;Decreased knowledge of precautions;Decreased knowledge of use of DME PT Treatment Interventions: DME instruction;Gait training;Stair training;Functional mobility training;Therapeutic activities;Therapeutic exercise;Patient/family education   PT Goals Acute Rehab PT Goals PT Goal Formulation: With patient Time For Goal Achievement: 06/19/12 Potential to Achieve Goals: Good Pt will Roll Supine to Right Side: with modified independence PT Goal: Rolling Supine to Right Side - Progress: Goal set today Pt will Roll Supine to Left Side: with modified independence PT Goal: Rolling Supine to Left Side - Progress: Goal set today Pt will go Supine/Side to Sit: with modified independence PT Goal: Supine/Side to Sit - Progress: Goal set today Pt will go Sit to Supine/Side: with modified independence PT Goal: Sit to Supine/Side - Progress: Goal set today Pt will go Sit to Stand: with modified independence PT Goal: Sit to Stand - Progress: Goal set today Pt will go Stand to Sit: with modified independence PT Goal: Stand to Sit - Progress: Goal set today Pt will Transfer Bed to Chair/Chair to Bed: with modified independence PT Transfer Goal: Bed to Chair/Chair to Bed -  Progress: Goal set today Pt will Ambulate: >150 feet;with modified  independence;with least restrictive assistive device PT Goal: Ambulate - Progress: Goal set today Pt will Go Up / Down Stairs: 1-2 stairs;with rail(s);with supervision PT Goal: Up/Down Stairs - Progress: Goal set today Additional Goals Additional Goal #1: Pt will verbalize and demonstrate knowledge of 3/3 back precautions.  PT Goal: Additional Goal #1 - Progress: Goal set today  Visit Information  Last PT Received On: 06/12/12 Assistance Needed: +1    Subjective Data      Prior Functioning  Home Living Lives With: Daughter Available Help at Discharge: Family Type of Home: House Home Access: Stairs to enter (from the garage) Secretary/administrator of Steps: 3 Home Layout: Two level;Able to live on main level with bedroom/bathroom Bathroom Shower/Tub: Health visitor: Standard Home Adaptive Equipment: None Prior Function Level of Independence: Independent Able to Take Stairs?: Yes Driving: Yes Vocation: Unemployed Communication Communication: No difficulties Dominant Hand: Right    Cognition  Overall Cognitive Status: Appears within functional limits for tasks assessed/performed Arousal/Alertness: Awake/alert Orientation Level: Oriented X4 / Intact Behavior During Session: [facility] for tasks performed    Extremity/Trunk Assessment Right Upper Extremity Assessment RUE ROM/Strength/Tone: [facility] for tasks assessed Left Upper Extremity Assessment LUE ROM/Strength/Tone: WFL for tasks assessed Right Lower Extremity Assessment RLE ROM/Strength/Tone: [facility] for tasks assessed Left Lower Extremity Assessment LLE ROM/Strength/Tone: WFL for tasks assessed Trunk Assessment Trunk Assessment: Normal   Balance    End of Session PT - End of Session Equipment Utilized During Treatment: Gait belt Activity Tolerance: Patient tolerated treatment well Patient left: in chair;with call bell/phone within reach Nurse Communication: Mobility status  GP     [facility]  Sheila Boyd 06/12/2012, 11:27 AM

## 2012-06-12 NOTE — Progress Notes (Signed)
Subjective: Patient reports Significant improvement in her leg pain back pain is well controlled  Objective: Vital signs in last 24 hours: Temp:  [96.8 F (36 C)-98 F (36.7 C)] 98 F (36.7 C) (10/01 0600) Pulse Rate:  [69-101] 72  (10/01 0600) Resp:  [14-29] 18  (10/01 0600) BP: (120-173)/(51-84) 120/54 mmHg (10/01 0600) SpO2:  [94 %-100 %] 100 % (10/01 0600) Weight:  [70.2 kg (154 lb 12.2 oz)] 70.2 kg (154 lb 12.2 oz) (09/30 1900)  Intake/Output from previous day: 09/30 0701 - 10/01 0700 In: 4130 [P.O.:680; I.V.:3050; Blood:200; IV Piggyback:200] Out: 4000 [Urine:2850; Drains:550; Blood:600] Intake/Output this shift:    Strength is 5 out of 5 wound is clean and dry  Lab Results: No results found for this basename: WBC:2,HGB:2,HCT:2,PLT:2 in the last 72 hours BMET No results found for this basename: NA:2,K:2,CL:2,CO2:2,GLUCOSE:2,BUN:2,CREATININE:2,CALCIUM:2 in the last 72 hours  Studies/Results: Dg Lumbar Spine 2-3 Views  06/11/2012  *RADIOLOGY REPORT*  Clinical Data: L4-5 PLIF  LUMBAR SPINE - 2-3 VIEW  Comparison: 04/18/2011  Findings: Two C-arm images show in progress films from L4-5 discectomy, decompression and fusion.  Interbody material is in place.  Pedicle screws are in place at L4 and L5.  IMPRESSION: PLIF in progress L4-5.   Original Report Authenticated By: Thomasenia Sales, M.D.     Assessment/Plan: Posterior day 1 from a plate at O1-3 doing very well progressively mobilized today with physical therapy.  LOS: 1 day     Jermaine Neuharth P 06/12/2012, 7:50 AM

## 2012-06-12 NOTE — Progress Notes (Signed)
UR COMPLETED  

## 2012-06-13 MED ORDER — HYDROCHLOROTHIAZIDE 12.5 MG PO CAPS
12.5000 mg | ORAL_CAPSULE | Freq: Every day | ORAL | Status: DC
  Administered 2012-06-13 – 2012-06-14 (×2): 12.5 mg via ORAL
  Filled 2012-06-13 (×2): qty 1

## 2012-06-13 NOTE — Progress Notes (Signed)
Occupational Therapy Treatment Patient Details Name: Sheila Boyd MRN: [id number] DOB: 10/15/1945 Today's Date: 06/13/2012 Time: 6578-4696 OT Time Calculation (min): 40 min  OT Assessment / Plan / Recommendation Comments on Treatment Session This 66 yo making progress since eval. Will continue to benefit from acute OT without need for follow up,    Follow Up Recommendations  No OT follow up       Equipment Recommendations  3 in 1 bedside comode;Rolling walker with 5" wheels       Frequency Min 2X/week   Plan Discharge plan remains appropriate    Precautions / Restrictions Precautions Precautions: Back Precaution Comments: Pt able to state 2/3 precautions at beginning of session and 3/3 precautions at end of session Required Braces or Orthoses: Spinal Brace Spinal Brace: Lumbar corset;Applied in sitting position Restrictions Weight Bearing Restrictions: No   Pertinent Vitals/Pain 0/10 pain at rest; 3/10 pain in back with activity    ADL  Grooming: Performed;Wash/dry hands;Min guard Where Assessed - Grooming: Unsupported standing Upper Body Dressing: Performed;Supervision/safety;Set up (for brace) Where Assessed - Upper Body Dressing: Unsupported sitting Lower Body Dressing: Performed;Supervision/safety;Set up (with AE) Where Assessed - Lower Body Dressing: Unsupported sit to stand Toilet Transfer: Performed;Minimal assistance Toilet Transfer Method: Sit to stand Toilet Transfer Equipment: Comfort height toilet;Grab bars Toileting - Clothing Manipulation and Hygiene: Performed;Min guard Where Assessed - Glass blower/designer Manipulation and Hygiene: Sit to stand from 3-in-1 or toilet Equipment Used: Back brace;Gait belt;Rolling walker Transfers/Ambulation Related to ADLs: Min A for sit to stand and stand to sit, Min guard A for ambulation ADL Comments: Pt educated on AE use and returned demonstrated. Pt made aware that she can purchase AE in gift shop if she wishes       OT Goals Acute Rehab OT Goals OT Goal Formulation: With patient ADL Goals ADL Goal: Lower Body Dressing - Progress: Progressing toward goals ADL Goal: Toilet Transfer - Progress: Progressing toward goals ADL Goal: Toileting - Clothing Manipulation - Progress: Met ADL Goal: Toileting - Hygiene - Progress: Met Miscellaneous OT Goals OT Goal: Miscellaneous Goal #1 - Progress: Progressing toward goals  Visit Information  Last OT Received On: 06/13/12 Assistance Needed: +1          Cognition  Overall Cognitive Status: Appears within functional limits for tasks assessed/performed Arousal/Alertness: Awake/alert Orientation Level: Appears intact for tasks assessed Behavior During Session: [facility] for tasks performed    Mobility   Bed Mobility Bed Mobility: Right Sidelying to Sit;Rolling Right;Sitting - Scoot to Edge of Bed Rolling Right: 4: Min assist;With rail Right Sidelying to Sit: 4: Min assist;HOB flat;With rails Sitting - Scoot to Edge of Bed: 5: Supervision Transfers Transfers: Sit to Stand;Stand to Sit Sit to Stand: 4: Min assist;With upper extremity assist;From bed Stand to Sit: 4: Min assist;With upper extremity assist;With armrests;To chair/3-in-1 Details for Transfer Assistance: Verbal cues for hand placement for sit to stand and stand to sit             End of Session OT - End of Session Equipment Utilized During Treatment: Gait belt;Back brace (RW) Activity Tolerance: Patient tolerated treatment well Patient left: in chair;with call bell/phone within reach;with family/visitor present (church sister)    Evette Georges [phone] 06/13/2012, 11:22 AM

## 2012-06-13 NOTE — Progress Notes (Signed)
Subjective: Patient reports Complete resolution of her leg pain back pain is improving she is tolerating regular diet and voiding  Objective: Vital signs in last 24 hours: Temp:  [97.5 F (36.4 C)-98.3 F (36.8 C)] 98.2 F (36.8 C) (10/02 0559) Pulse Rate:  [65-77] 76  (10/02 0559) Resp:  [17-18] 17  (10/02 0559) BP: (123-137)/(56-80) 132/69 mmHg (10/02 0559) SpO2:  [98 %-100 %] 100 % (10/02 0559)  Intake/Output from previous day: 10/01 0701 - 10/02 0700 In: -  Out: 200 [Urine:100; Drains:100] Intake/Output this shift:    Strength is 5 out of 5 wound is clean and dry and  Lab Results: No results found for this basename: WBC:2,HGB:2,HCT:2,PLT:2 in the last 72 hours BMET No results found for this basename: NA:2,K:2,CL:2,CO2:2,GLUCOSE:2,BUN:2,CREATININE:2,CALCIUM:2 in the last 72 hours  Studies/Results: Dg Lumbar Spine 2-3 Views  06/11/2012  *RADIOLOGY REPORT*  Clinical Data: L4-5 PLIF  LUMBAR SPINE - 2-3 VIEW  Comparison: 04/18/2011  Findings: Two C-arm images show in progress films from L4-5 discectomy, decompression and fusion.  Interbody material is in place.  Pedicle screws are in place at L4 and L5.  IMPRESSION: PLIF in progress L4-5.   Original Report Authenticated By: Thomasenia Sales, M.D.     Assessment/Plan: Posterior day 2 from plus L4-5 progressive mobilization today possible discharge legs are  LOS: 2 days     Jadan Hinojos P 06/13/2012, 7:42 AM

## 2012-06-13 NOTE — Progress Notes (Signed)
Physical Therapy Treatment Patient Details Name: BRIANE BIRDEN MRN: [id number] DOB: [birth year] Today's Date: 06/13/2012 Time: 1324-4010 PT Time Calculation (min): 13 min  PT Assessment / Plan / Recommendation Comments on Treatment Session  Pt progressing well with PT goals & mobility.   On track to safely d/c home when MD feels medically ready.      Follow Up Recommendations  No PT follow up    Barriers to Discharge        Equipment Recommendations  3 in 1 bedside comode;Rolling walker with 5" wheels    Recommendations for Other Services    Frequency Min 5X/week   Plan Discharge plan remains appropriate    Precautions / Restrictions Precautions Precautions: Back Precaution Comments: Pt able to recall 2/3 back precautions.  Reviewed all 3.   Required Braces or Orthoses: Spinal Brace Spinal Brace: Lumbar corset;Applied in sitting position Restrictions Weight Bearing Restrictions: No       Mobility  Bed Mobility Bed Mobility: Sit to Sidelying Left Rolling Right: 4: Min assist;With rail Right Sidelying to Sit: 4: Min assist;HOB flat;With rails Sitting - Scoot to Edge of Bed: 5: Supervision Sit to Sidelying Left: 4: Min assist;HOB flat Details for Bed Mobility Assistance: Cues for sequencing, technique, & reinforcement of back precautions.  Assist to maintain precautions.   Transfers Transfers: Sit to Stand;Stand to Sit Sit to Stand: 4: Min guard;With upper extremity assist;With armrests;From bed Stand to Sit: 4: Min guard;With upper extremity assist;With armrests;To bed Details for Transfer Assistance: Verbal cues for hand placement for sit to stand and stand to sit Ambulation/Gait Ambulation/Gait Assistance: 5: Supervision Ambulation Distance (Feet): 300 Feet Assistive device: Rolling walker Ambulation/Gait Assistance Details: cues to stay inside RW Gait Pattern: Step-through pattern;Decreased stride length Stairs: No Wheelchair Mobility Wheelchair Mobility: No        PT Goals Acute Rehab PT Goals Time For Goal Achievement: 06/19/12 Potential to Achieve Goals: Good Pt will Roll Supine to Right Side: with modified independence Pt will Roll Supine to Left Side: with modified independence Pt will go Supine/Side to Sit: with modified independence Pt will go Sit to Supine/Side: with modified independence PT Goal: Sit to Supine/Side - Progress: Progressing toward goal Pt will go Sit to Stand: with modified independence PT Goal: Sit to Stand - Progress: Progressing toward goal Pt will go Stand to Sit: with modified independence PT Goal: Stand to Sit - Progress: Progressing toward goal Pt will Transfer Bed to Chair/Chair to Bed: with modified independence Pt will Ambulate: >150 feet;with modified independence;with least restrictive assistive device PT Goal: Ambulate - Progress: Progressing toward goal Pt will Go Up / Down Stairs: 1-2 stairs;with rail(s);with supervision Additional Goals Additional Goal #1: Pt will verbalize and demonstrate knowledge of 3/3 back precautions.  PT Goal: Additional Goal #1 - Progress: Progressing toward goal  Visit Information  Last PT Received On: 06/13/12 Assistance Needed: +1    Subjective Data      Cognition  Overall Cognitive Status: Appears within functional limits for tasks assessed/performed Arousal/Alertness: Awake/alert Orientation Level: Appears intact for tasks assessed Behavior During Session: [facility] for tasks performed    Balance     End of Session PT - End of Session Equipment Utilized During Treatment: Gait belt;Back brace Activity Tolerance: Patient tolerated treatment well Patient left: in bed;with call bell/phone within reach;with family/visitor present Nurse Communication: Mobility status     Verdell Face, [place] [phone] 06/13/2012

## 2012-06-13 NOTE — Care Management Note (Addendum)
    Page 1 of 1   06/14/2012     3:46:10 PM   CARE MANAGEMENT NOTE 06/14/2012  Patient:  Sheila Boyd, Sheila Boyd   Account Number:  [id number]  Date Initiated:  06/13/2012  Documentation initiated by:  [facility]  Subjective/Objective Assessment:   Admitted postop L4-5 laminectomy.     Action/Plan:   PT/OT-recommending rolling walker and 3 in 1   Anticipated DC Date:  06/14/2012   Anticipated DC Plan:  HOME/SELF CARE         Choice offered to / List presented to:     DME arranged  WALKER - ROLLING  3-N-1      DME agency  [facility]        Status of service:  Completed, signed off Medicare Important Message given?   (If response is "NO", the following Medicare IM given date fields will be blank) Date Medicare IM given:   Date Additional Medicare IM given:    Discharge Disposition:  HOME W HOME HEALTH SERVICES  Per UR Regulation:  Reviewed for med. necessity/level of care/duration of stay  If discussed at Long Length of Stay Meetings, dates discussed:    Comments:  06/14/12 Onnie Boer, RN, BSN 1544 PT WAS ADMITTED WITH DME'S FROM [facility].  06/13/12 Spoke with patient about equipment, she is agreeable with getting a rolling walker and 3 in 1 as recommended by PT. Contacted Darian at [facility] and left vm requesting rw and 3in 1 on 06/14/12. No HHC recommended by therapy. Anticipate d/c to home 06/24/12. Jacquelynn Cree RN, BSN, CCM

## 2012-06-14 MED ORDER — CYCLOBENZAPRINE HCL 10 MG PO TABS: 10.0000 mg | ORAL_TABLET | Freq: Three times a day (TID) | ORAL | Status: DC | PRN

## 2012-06-14 MED ORDER — HYDROCODONE-ACETAMINOPHEN 5-500 MG PO TABS: 1.0000 | ORAL_TABLET | Freq: Four times a day (QID) | ORAL | Status: DC | PRN

## 2012-06-14 MED FILL — Heparin Sodium (Porcine) Inj 1000 Unit/ML: INTRAMUSCULAR | Qty: 30 | Status: AC

## 2012-06-14 MED FILL — Sodium Chloride IV Soln 0.9%: INTRAVENOUS | Qty: 1000 | Status: AC

## 2012-06-14 MED FILL — Sodium Chloride Irrigation Soln 0.9%: Qty: 3000 | Status: AC

## 2012-06-14 NOTE — Discharge Summary (Signed)
  Physician Discharge Summary  Patient ID: Sheila Boyd MRN: [id number] DOB/AGE: 67/11/1945 67 y.o.  Admit date: 06/11/2012 Discharge date: 06/14/2012  Admission Diagnoses: grade 1 spondylolisthesis L4-5  Discharge Diagnoses: Same Active Problems:  * No active hospital problems. *    Discharged Condition: good  Hospital Course: Patient admitted hospital underwent an L4-5 posterior lumbar interbody fusion postoperatively patient did very well recovered in the floor on the floor patient was convalescing well and living and voiding spontaneously tolerating regular diet pain was well-controlled and was able be discharged home scheduled followup approximately 1-2 weeks.  Consults: Significant Diagnostic Studies: Treatments: L4-5 posterior lumbar interbody fusion Discharge Exam: Blood pressure 112/58, pulse 71, temperature 98.1 F (36.7 C), temperature source Oral, resp. rate 18, height 5' 6.14" (1.68 m), weight 70.2 kg (154 lb 12.2 oz), SpO2 100.00%. strength 5 out of 5 wound clean and dry  Disposition: Home     Medication List     As of 06/14/2012  9:24 AM    TAKE these medications         acetaminophen 500 MG tablet   Commonly known as: TYLENOL   Take 1,000 mg by mouth every 6 (six) hours as needed. pain      cyclobenzaprine 10 MG tablet   Commonly known as: FLEXERIL   Take 1 tablet (10 mg total) by mouth 3 (three) times daily as needed for muscle spasms.      hydrochlorothiazide 12.5 MG tablet   Commonly known as: HYDRODIURIL   Take 12.5 mg by mouth daily.      HYDROcodone-acetaminophen 5-500 MG per tablet   Commonly known as: VICODIN   Take 1 tablet by mouth every 6 (six) hours as needed for pain.      lansoprazole 15 MG capsule   Commonly known as: PREVACID   Take 15 mg by mouth daily.      levothyroxine 25 MCG tablet   Commonly known as: SYNTHROID, LEVOTHROID   Take 25 mcg by mouth daily.         Signed: Cheryel Kyte P 06/14/2012, 9:24 AM

## 2012-06-14 NOTE — Progress Notes (Signed)
Occupational Therapy Treatment Patient Details Name: Sheila Boyd MRN: [id number] DOB: 28-Jul-1946 Today's Date: 06/14/2012 Time: 8657-8469 OT Time Calculation (min): 12 min  OT Assessment / Plan / Recommendation Comments on Treatment Session Pt. is progressing with BADLs.  Requires verbal cues for safety and hand placement with functional mobility    Follow Up Recommendations  No OT follow up    Barriers to Discharge       Equipment Recommendations  Rolling walker with 5" wheels;3 in 1 bedside comode    Recommendations for Other Services    Frequency Min 2X/week   Plan Discharge plan remains appropriate    Precautions / Restrictions Precautions Precautions: Fall Precaution Booklet Issued: Yes (comment) Precaution Comments: Able to verbalize 3/3 precautions Required Braces or Orthoses: Spinal Brace Spinal Brace: Lumbar corset;Applied in sitting position Restrictions Weight Bearing Restrictions: No   Pertinent Vitals/Pain     ADL  Upper Body Bathing: Simulated;Supervision/safety Where Assessed - Upper Body Bathing: Supported standing Lower Body Bathing: Simulated;Supervision/safety Where Assessed - Lower Body Bathing: Supported standing (with AE) Tub/Shower Transfer: Performed;Min guard Tub/Shower Transfer Method: Science writer: Walk in shower Equipment Used: Back brace;Rolling walker Transfers/Ambulation Related to ADLs: min guard assist, and cues for proper hand placement ADL Comments: Pt. has acquired AE.  Pt. does not wish to practice further.  Pt. instructed in proper technique for shower transfer, and returned demonstration    OT Diagnosis:    OT Problem List:   OT Treatment Interventions:     OT Goals Acute Rehab OT Goals OT Goal Formulation: With patient Time For Goal Achievement: 06/26/12 Potential to Achieve Goals: Good ADL Goals Pt Will Perform Upper Body Bathing: with supervision;Standing at sink;Supported ADL Goal:  Product manager - Progress: Goal set today Pt Will Perform Lower Body Bathing: with supervision;Standing at sink;Supported ADL Goal: Lower Body Bathing - Progress: Goal set today Pt Will Perform Tub/Shower Transfer: Shower transfer;Grab bars;with DME;Ambulation (min guard assist) ADL Goal: Tub/Shower Transfer - Progress: Met Miscellaneous OT Goals OT Goal: Miscellaneous Goal #1 - Progress: Met  Visit Information  Last OT Received On: 06/14/12 Assistance Needed: +1    Subjective Data      Prior Functioning       Cognition  Overall Cognitive Status: Appears within functional limits for tasks assessed/performed Arousal/Alertness: Awake/alert Orientation Level: Appears intact for tasks assessed Behavior During Session: [facility] for tasks performed    Mobility  Shoulder Instructions Bed Mobility Bed Mobility: Rolling Left;Left Sidelying to Sit;Sitting - Scoot to [place] of Bed;Sit to Sidelying Left Rolling Left: 5: Supervision Left Sidelying to Sit: 5: Supervision;HOB elevated Sitting - Scoot to Edge of Bed: 5: Supervision Sit to Sidelying Left: 5: Supervision;HOB flat Details for Bed Mobility Assistance: verbal cues to avoid twisting Transfers Transfers: Sit to Stand;Stand to Sit Sit to Stand: 5: Supervision;With upper extremity assist;From bed Stand to Sit: 5: Supervision;With upper extremity assist;To bed Details for Transfer Assistance: Pt. requires verbal cues for correct hand placement       Exercises      Balance     End of Session OT - End of Session Equipment Utilized During Treatment: Back brace Activity Tolerance: Patient tolerated treatment well Patient left: in bed;with call bell/phone within reach;with bed alarm set  GO     Havilah Topor, Ursula Alert M 06/14/2012, 10:21 AM

## 2012-06-14 NOTE — Progress Notes (Signed)
Physical Therapy Treatment Patient Details Name: Sheila Boyd MRN: [id number] DOB: [birth year] Today's Date: 06/14/2012 Time: 8295-6213 PT Time Calculation (min): 31 min  PT Assessment / Plan / Recommendation Comments on Treatment Session  Progressing well, still limited by pain but all education provided for safe d/c home as pt reports she will d/c home today.     Follow Up Recommendations  No PT follow up    Barriers to Discharge        Equipment Recommendations  Rolling walker with 5" wheels;3 in 1 bedside comode    Recommendations for Other Services    Frequency     Plan Discharge plan remains appropriate;Frequency remains appropriate    Precautions / Restrictions Precautions Precautions: Fall Precaution Booklet Issued: Yes (comment) Precaution Comments: Pt able to recall 2/3 initially, after education pt able to recall 3/3 at the end of the session Spinal Brace: Lumbar corset;Applied in sitting position Restrictions Weight Bearing Restrictions: No       Mobility  Bed Mobility Bed Mobility: Sit to Sidelying Left Sit to Sidelying Left: 5: Supervision;HOB flat;Other (comment) (bed height elevated to simulate home situation) Details for Bed Mobility Assistance: sequencing cues using step stool to improve stand->sit onto tall bed, min verbal cues for technique sit->sidelying left; pt verbalized correct technique sidelying->sit as she did not want to practice this again Transfers Transfers: Sit to Stand;Stand to Sit Sit to Stand: 5: Supervision;From elevated surface;With upper extremity assist;From chair/3-in-1;From bed;With armrests Stand to Sit: 5: Supervision;With upper extremity assist;With armrests;To bed;To chair/3-in-1;To elevated surface Details for Transfer Assistance: pt slow to rise 2/2 pain, verbal cues occassionally for safe hand placement Ambulation/Gait Ambulation/Gait Assistance: 5: Supervision Ambulation Distance (Feet): 250 Feet Assistive device:  Rolling walker Ambulation/Gait Assistance Details: cues for tall posture and one minimal cue for safe spacing with RW Gait Pattern: Step-through pattern General Gait Details: decreased step height, improved speed after about 100 ft as pt reports pain slowing down slightly Stairs: Yes Stairs Assistance: 5: Supervision Stairs Assistance Details (indicate cue type and reason): verbal cues for technique Stair Management Technique: Sideways;One rail Left Number of Stairs: 2       PT Goals Acute Rehab PT Goals PT Goal: Sit to Supine/Side - Progress: Progressing toward goal PT Goal: Sit to Stand - Progress: Progressing toward goal PT Goal: Stand to Sit - Progress: Progressing toward goal PT Transfer Goal: Bed to Chair/Chair to Bed - Progress: Progressing toward goal PT Goal: Ambulate - Progress: Progressing toward goal PT Goal: Up/Down Stairs - Progress: Met Additional Goals PT Goal: Additional Goal #1 - Progress: Partly met  Visit Information  Last PT Received On: 06/14/12 Assistance Needed: +1    Subjective Data  Subjective: Im just very sore today.    Cognition  Overall Cognitive Status: Appears within functional limits for tasks assessed/performed Arousal/Alertness: Awake/alert Orientation Level: Appears intact for tasks assessed Behavior During Session: [facility] for tasks performed    Balance     End of Session PT - End of Session Equipment Utilized During Treatment: Gait belt Activity Tolerance: Patient tolerated treatment well Patient left: in bed;with call bell/phone within reach Nurse Communication: Mobility status   GP     [facility] HELEN 06/14/2012, 8:59 AM

## 2012-06-14 NOTE — Progress Notes (Signed)
Subjective: Patient reports She still much better no leg pain very minimal back stiffness angling and voiding spontaneously ready for discharge  Objective: Vital signs in last 24 hours: Temp:  [98.1 F (36.7 C)-99.5 F (37.5 C)] 98.1 F (36.7 C) (10/03 0656) Pulse Rate:  [71-90] 71  (10/03 0656) Resp:  [18-20] 18  (10/03 0656) BP: (112-157)/(43-66) 112/58 mmHg (10/03 0656) SpO2:  [98 %-100 %] 100 % (10/03 0656)  Intake/Output from previous day: 10/02 0701 - 10/03 0700 In: 850 [P.O.:850] Out: 2900 [Urine:2750; Drains:150] Intake/Output this shift: Total I/O In: 400 [P.O.:400] Out: 120 [Drains:120]  strength is 5 out of 5 wound is clean and dry  Lab Results: No results found for this basename: WBC:2,HGB:2,HCT:2,PLT:2 in the last 72 hours BMET No results found for this basename: NA:2,K:2,CL:2,CO2:2,GLUCOSE:2,BUN:2,CREATININE:2,CALCIUM:2 in the last 72 hours  Studies/Results: No results found.  Assessment/Plan: Discharge home  LOS: 3 days     Niyam Bisping P 06/14/2012, 9:22 AM

## 2012-06-15 ENCOUNTER — Encounter (HOSPITAL_COMMUNITY): Payer: Self-pay | Admitting: Emergency Medicine

## 2012-06-15 ENCOUNTER — Emergency Department (HOSPITAL_COMMUNITY): Payer: Medicare Other

## 2012-06-15 ENCOUNTER — Emergency Department (HOSPITAL_COMMUNITY)
Admission: EM | Admit: 2012-06-15 | Discharge: 2012-06-15 | Disposition: A | Discharge status: Home / Self Care | Payer: Medicare Other | Attending: Emergency Medicine | Admitting: Emergency Medicine

## 2012-06-15 DIAGNOSIS — R110 Nausea: Secondary | ICD-10-CM | POA: Insufficient documentation

## 2012-06-15 DIAGNOSIS — M545 Low back pain, unspecified: Secondary | ICD-10-CM | POA: Insufficient documentation

## 2012-06-15 DIAGNOSIS — Y838 Other surgical procedures as the cause of abnormal reaction of the patient, or of later complication, without mention of misadventure at the time of the procedure: Secondary | ICD-10-CM | POA: Insufficient documentation

## 2012-06-15 DIAGNOSIS — K219 Gastro-esophageal reflux disease without esophagitis: Secondary | ICD-10-CM | POA: Insufficient documentation

## 2012-06-15 DIAGNOSIS — R5082 Postprocedural fever: Secondary | ICD-10-CM | POA: Insufficient documentation

## 2012-06-15 DIAGNOSIS — R630 Anorexia: Secondary | ICD-10-CM | POA: Insufficient documentation

## 2012-06-15 DIAGNOSIS — I10 Essential (primary) hypertension: Secondary | ICD-10-CM | POA: Insufficient documentation

## 2012-06-15 DIAGNOSIS — Z79899 Other long term (current) drug therapy: Secondary | ICD-10-CM | POA: Insufficient documentation

## 2012-06-15 DIAGNOSIS — M129 Arthropathy, unspecified: Secondary | ICD-10-CM | POA: Insufficient documentation

## 2012-06-15 DIAGNOSIS — E039 Hypothyroidism, unspecified: Secondary | ICD-10-CM | POA: Insufficient documentation

## 2012-06-15 LAB — CBC WITH DIFFERENTIAL/PLATELET
Basophils Absolute: 0 10*3/uL (ref 0.0–0.1)
HCT: 32.1 % — ABNORMAL LOW (ref 36.0–46.0)
Hemoglobin: 10.7 g/dL — ABNORMAL LOW (ref 12.0–15.0)
Lymphocytes Relative: 18 % (ref 12–46)
Monocytes Absolute: 1.9 10*3/uL — ABNORMAL HIGH (ref 0.1–1.0)
Neutro Abs: 7.9 10*3/uL — ABNORMAL HIGH (ref 1.7–7.7)
RDW: 14 % (ref 11.5–15.5)
WBC: 11.9 10*3/uL — ABNORMAL HIGH (ref 4.0–10.5)

## 2012-06-15 LAB — BASIC METABOLIC PANEL
CO2: 26 mEq/L (ref 19–32)
Chloride: 93 mEq/L — ABNORMAL LOW (ref 96–112)
Creatinine, Ser: 0.91 mg/dL (ref 0.50–1.10)
Potassium: 3.3 mEq/L — ABNORMAL LOW (ref 3.5–5.1)

## 2012-06-15 LAB — URINE MICROSCOPIC-ADD ON

## 2012-06-15 LAB — URINALYSIS, ROUTINE W REFLEX MICROSCOPIC
Glucose, UA: NEGATIVE mg/dL
Ketones, ur: NEGATIVE mg/dL
Specific Gravity, Urine: 1.01 (ref 1.005–1.030)
pH: 6.5 (ref 5.0–8.0)

## 2012-06-15 LAB — SEDIMENTATION RATE: Sed Rate: 75 mm/hr — ABNORMAL HIGH (ref 0–22)

## 2012-06-15 LAB — LACTIC ACID, PLASMA: Lactic Acid, Venous: 1.3 mmol/L (ref 0.5–2.2)

## 2012-06-15 MED ORDER — ACETAMINOPHEN 325 MG PO TABS
650.0000 mg | ORAL_TABLET | Freq: Once | ORAL | Status: AC
  Administered 2012-06-15: 650 mg via ORAL
  Filled 2012-06-15: qty 2

## 2012-06-15 MED ORDER — GADOBENATE DIMEGLUMINE 529 MG/ML IV SOLN
15.0000 mL | Freq: Once | INTRAVENOUS | Status: AC
  Administered 2012-06-15: 15 mL via INTRAVENOUS

## 2012-06-15 NOTE — ED Notes (Signed)
Patient currently resting quietly in bed; no respiratory or acute distress noted.  Patient updated on plan of care; informed patient that we are currently waiting on disposition from EDP.  Patient has no other questions or concerns at this time; will continue to monitor. 

## 2012-06-15 NOTE — ED Notes (Signed)
DG Chest x-ray done; results back at 1511.

## 2012-06-15 NOTE — ED Notes (Signed)
Pt went to MRI.

## 2012-06-15 NOTE — ED Provider Notes (Signed)
History     CSN: [id number]  Arrival date & time 06/15/12  1400   First MD Initiated Contact with Patient 06/15/12 1455      Chief Complaint  Patient presents with  . Fever    (Consider location/radiation/quality/duration/timing/severity/associated sxs/prior treatment) HPI Comments: Patient presents with fever for the past one day. She was discharged yesterday after undergoing a laminectomy on September 30. Reports difficulty controlling her urine today and no bowel movement for the past 3 days. She had fever to 11.8. He endorses poor appetite and nausea. She denies any chest pain, shortness of breath, cough, abdominal pain. She reports no bleeding or drainage from her incision. She has some soreness in her low back but this is well control with Tylenol. Is not radiate down her legs.  The history is provided by the patient.    Past Medical History  Diagnosis Date  . GERD (gastroesophageal reflux disease)   . Hypothyroidism   . Arthritis   . Hypertension     Past Surgical History  Procedure Date  . Abdominal hysterectomy   . Foot surgery     right foot  -- pin placed  . Breast surgery     benign  lumps removed  . Posterior fusion lumbar spine 06/11/2012    No family history on file.  History  Substance Use Topics  . Smoking status: Never Smoker   . Smokeless tobacco: Never Used  . Alcohol Use: No    OB History    Grav Para Term Preterm Abortions TAB SAB Ect Mult Living                  Review of Systems  Constitutional: Positive for fever, activity change and appetite change.  HENT: Negative for congestion and rhinorrhea.   Respiratory: Negative for cough and shortness of breath.   Cardiovascular: Negative for chest pain.  Gastrointestinal: Negative for nausea, vomiting and abdominal pain.  Genitourinary: Positive for difficulty urinating. Negative for dysuria, vaginal bleeding and vaginal discharge.  Musculoskeletal: Positive for back pain.  Skin: Positive  for wound. Negative for rash.  Neurological: Negative for dizziness, weakness and headaches.    Allergies  Penicillins  Home Medications   Current Outpatient Rx  Name Route Sig Dispense Refill  . CYCLOBENZAPRINE HCL 10 MG PO TABS Oral Take 1 tablet (10 mg total) by mouth 3 (three) times daily as needed for muscle spasms. 80 tablet 1  . HYDROCHLOROTHIAZIDE 12.5 MG PO TABS Oral Take 12.5 mg by mouth daily.    Marland Kitchen LANSOPRAZOLE 15 MG PO CPDR Oral Take 15 mg by mouth daily.    Marland Kitchen LEVOTHYROXINE SODIUM 25 MCG PO TABS Oral Take 25 mcg by mouth daily.      BP 115/60  Pulse 79  Temp 99 F (37.2 C) (Oral)  Resp 18  SpO2 97%  Physical Exam  Constitutional: [name] oriented to person, place, and time. She appears well-developed and well-nourished. No distress.  HENT:  Head: Normocephalic and atraumatic.  Mouth/Throat: Oropharynx is clear and moist. No oropharyngeal exudate.  Eyes: Conjunctivae normal and EOM are normal. Pupils are equal, round, and reactive to light.  Neck: Normal range of motion. Neck supple.  Cardiovascular: Normal rate, regular rhythm and normal heart sounds.   No murmur heard. Pulmonary/Chest: Effort normal and breath sounds normal. No respiratory distress.  Abdominal: Soft. There is no tenderness. There is no rebound and no guarding.  Genitourinary:       Normal rectal tone  Musculoskeletal:  Normal range of motion. She exhibits tenderness. She exhibits no edema.       Midline lumbar incision Steri-Strips intact, no bleeding or drainage. No cellulitis. 5/5 strength in bilateral lower extremities. Ankle plantar and dorsiflexion intact. Great toe extension intact bilaterally. +2 DP and PT pulses. +2 patellar reflexes bilaterally. Normal gait.   Neurological: [name] alert and oriented to person, place, and time. No cranial nerve deficit.  Skin: Skin is warm.    ED Course  Procedures (including critical care time)  Labs Reviewed  CBC WITH DIFFERENTIAL - Abnormal;  Notable for the following:    WBC 11.9 (*)     RBC 3.77 (*)     Hemoglobin 10.7 (*)     HCT 32.1 (*)     Neutro Abs 7.9 (*)     Monocytes Relative 16 (*)     Monocytes Absolute 1.9 (*)     All other components within normal limits  BASIC METABOLIC PANEL - Abnormal; Notable for the following:    Sodium 131 (*)     Potassium 3.3 (*)     Chloride 93 (*)     Glucose, Bld 121 (*)     GFR calc non Af Amer 64 (*)     GFR calc Af Amer 74 (*)     All other components within normal limits  URINALYSIS, ROUTINE W REFLEX MICROSCOPIC - Abnormal; Notable for the following:    APPearance CLOUDY (*)     Leukocytes, UA TRACE (*)     All other components within normal limits  SEDIMENTATION RATE - Abnormal; Notable for the following:    Sed Rate 75 (*)     All other components within normal limits  URINE MICROSCOPIC-ADD ON - Abnormal; Notable for the following:    Squamous Epithelial / LPF FEW (*)     All other components within normal limits  LACTIC ACID, PLASMA  CULTURE, BLOOD (ROUTINE X 2)  CULTURE, BLOOD (ROUTINE X 2)   Dg Chest 2 View  06/15/2012  *RADIOLOGY REPORT*  Clinical Data: Fever.  CHEST - 2 VIEW  Comparison: 05/29/2012  Findings: Two views of the chest were obtained.  Again noted is soft tissue prominence in the upper mediastinum consistent with enlarged thyroid tissue.  Heart size is stable.  Lungs are clear without airspace disease or edema.  No evidence for pleural effusions. Few linear densities at the lung bases could represent atelectasis or scarring.  IMPRESSION: No acute cardiopulmonary disease.   Original Report Authenticated By: Richarda Overlie, M.D.    Mr Lumbar Spine W Wo Contrast  06/15/2012  *RADIOLOGY REPORT*  Clinical Data: Fever.  Incontinence.  Recent discectomy and fusion.  MRI LUMBAR SPINE WITHOUT AND WITH CONTRAST  Technique:  Multiplanar and multiecho pulse sequences of the lumbar spine were obtained without and with intravenous contrast.  Contrast: 15mL MULTIHANCE  GADOBENATE DIMEGLUMINE 529 MG/ML IV SOLN  Comparison: Operative radiographs 06/11/2012  Findings: There is no disc pathology at L2-3 or above.  The L3-4 disc bulges mildly.  At L4-5, there has been posterior decompression, discectomy and fusion.  Interbody fusion material is in place.  There are bilateral pedicle screws and posterior rods. The L5-S1 disc shows mild annular tearing and annular bulging.  There is a fluid collection within the operative bed posterior to the thecal sac.  This contains heterogeneous material.  This causes mass effect upon the dorsal aspect of the thecal sac with flattening of the thecal sac and crowding of the nerve  roots. Fluid collection extends within the dorsal spinal canal up to the L2 level.  The appearance suggests a subdural collection.  This could be sterile fluid or could relate to infection.  IMPRESSION: Decompression at L4-5 with discectomy, interbody fusion material placement and placement of pedicle screws and posterior rods. Hardware appears grossly well positioned.  Fluid collection posterior to the spinal canal at the decompression level, containing heterogeneous material and exerting mass effect upon the thecal sac with flattening of the thecal sac and crowding of the nerve roots.  The fluid collection extends into the spinal canal dorsal to the thecal sac, up to the L2 level.  In that region, the appearance suggests that it could be actually subdural.  This fluid is nonspecific.  It could represent postoperative seroma/hematoma.  The possibility of infection is not excluded by imaging.  Spinal fluid leakage is an additional possibility.  Based on the clinical history, the compressive effect upon the thecal sac at the L4-5 level may be significant.   Original Report Authenticated By: Thomasenia Sales, M.D.      1. Fever postop       MDM  Postoperative fever and urinary incontinence after laminectomy 5 days ago. No focal neurological deficits on exam. Normal  rectal tone.  We'll pursue labs, MRI, discuss with neurosurgery.  D/w Dr. Yetta Barre for Dr. Wynetta Emery.  He suspects atelectasis v UTI.  States too early for a post operative infection.  With intact neuro exam and patient appearing comfortable, states conservative management appropriate.  MRI results d/w Dr. Phoebe Perch.  States inconclusive as postoperative fluid collection suspected.  Doubts abscess.  Wound appears clean externally.  No neuro deficits.  Recommends incentive spirometry and followup with Dr. Wynetta Emery on Monday.  Glynn Octave, MD 06/15/12 2127

## 2012-06-15 NOTE — ED Provider Notes (Signed)
MRI has resulted.  Dr. Manus Gunning discussed results with neurosurgery, and I discussed the lab and radiology results with the patient.  Patient not reporting any discomfort at present, and has pain medication at home.  Dr. Wynetta Emery has been in to see the patient.  Patient will be sent home with incentive spirometer.  Patient instructed to call the office on Monday to schedule follow-up with Dr. Wynetta Emery.  Jimmye Norman, NP 06/15/12 2357

## 2012-06-15 NOTE — ED Notes (Signed)
Received bedside report from [place], [place].  Patient currently sitting up in bed; no respiratory or acute distress noted.  Family present at bedside. Patient updated on plan of care; informed patient that we are currently waiting on EDP to come and talk about test results.  Patient has no other questions or concerns at this time; will continue to monitor.

## 2012-06-15 NOTE — ED Notes (Signed)
EDP at bedside  

## 2012-06-15 NOTE — ED Notes (Addendum)
Pt arrived by EMS. Pt had a spinal fusion Monday 06/11/12. Pt took her temp this morning and it was 101.3. Her daughter called the surgeon and he told her to go to the ED for evaluation because of fever. Pt denies pain. C/o some fatigue. Dr. Wynetta Emery is the surgeon. Pt also c/o a bitter taste in her mouth.

## 2012-06-15 NOTE — ED Notes (Signed)
Pt states she recently had spinal fusion surgery on Monday 06/11/12 by Dr. Wynetta Emery. Pt states temperature of 101.3 at home. Pt also states difficulty having bowel movements and urinary incontinence since surgery. Denies pain at the time. [name] conscious alert and oriented x4. No signs of distress noted at the time.

## 2012-06-15 NOTE — ED Notes (Signed)
MRI done; results back at 1936.

## 2012-06-15 NOTE — ED Notes (Signed)
Pt returned from MRI °

## 2012-06-15 NOTE — ED Notes (Signed)
Patient given discharge paperwork; went over discharge instructions with patient.  Patient instructed to follow up with Dr. Wynetta Emery as discussed, to use incentive spirometer as directed, and to return to the ED for new, worsening, or concerning symptoms.

## 2012-06-16 NOTE — ED Provider Notes (Signed)
Medical screening examination/treatment/procedure(s) were conducted as a shared visit with non-physician practitioner(s) and myself.  I personally evaluated the patient during the encounter    Hannah Strader, MD 06/16/12 0007 

## 2012-06-21 LAB — CULTURE, BLOOD (ROUTINE X 2): Culture: NO GROWTH

## 2013-06-20 DIAGNOSIS — H269 Unspecified cataract: Secondary | ICD-10-CM | POA: Insufficient documentation

## 2013-07-10 ENCOUNTER — Other Ambulatory Visit: Payer: Self-pay | Admitting: Neurosurgery

## 2013-07-10 DIAGNOSIS — M4316 Spondylolisthesis, lumbar region: Secondary | ICD-10-CM

## 2013-07-22 ENCOUNTER — Ambulatory Visit
Admission: RE | Admit: 2013-07-22 | Discharge: 2013-07-22 | Disposition: A | Discharge status: Home / Self Care | Payer: Medicare Other | Source: Ambulatory Visit | Attending: Neurosurgery | Admitting: Neurosurgery

## 2013-07-22 DIAGNOSIS — M4316 Spondylolisthesis, lumbar region: Secondary | ICD-10-CM

## 2014-03-19 IMAGING — US US THYROID BIOPSY
1 series · 8 of 8 positions shown · non-contrast
Comparison: Thyroid biopsy was thoroughly discussed with the patient and
questions were answered.

CLINICAL DATA: Multinodular goiter.  Normal thyroid tissue has
been replaced by nodular tissue bilaterally.

ULTRASOUND GUIDED NEEDLE ASPIRATE BIOPSY OF BILATERAL THYROID LOBES

[Series 1: us thyroid biopsy · 8 acquisitions, 8 frames shown]
[im 1/8]
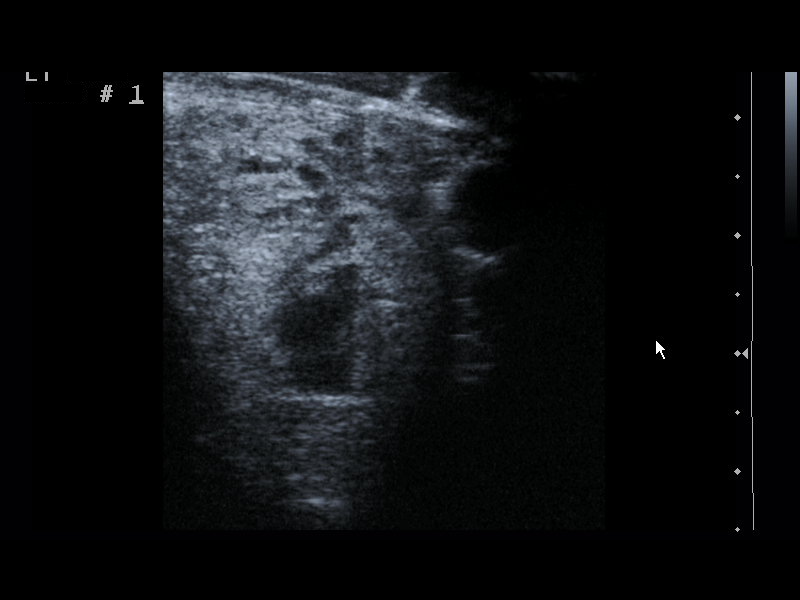
[im 2/8]
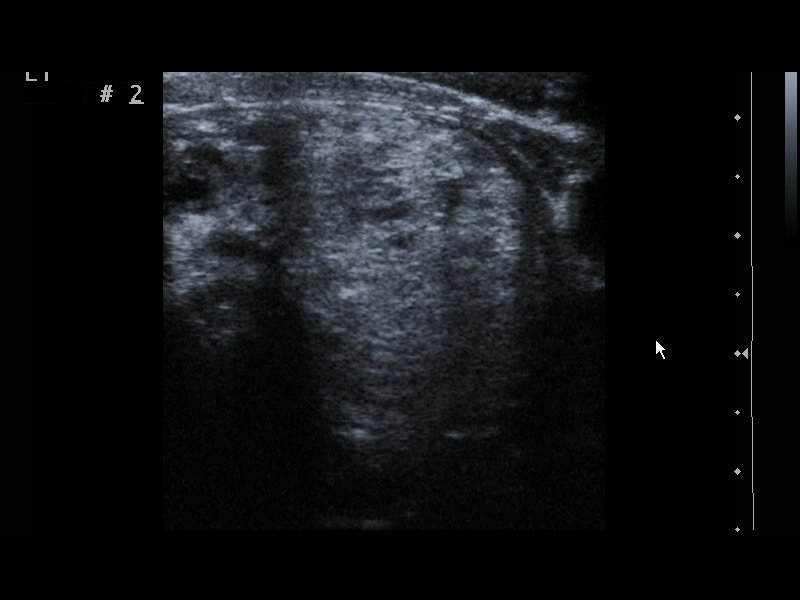
[im 3/8]
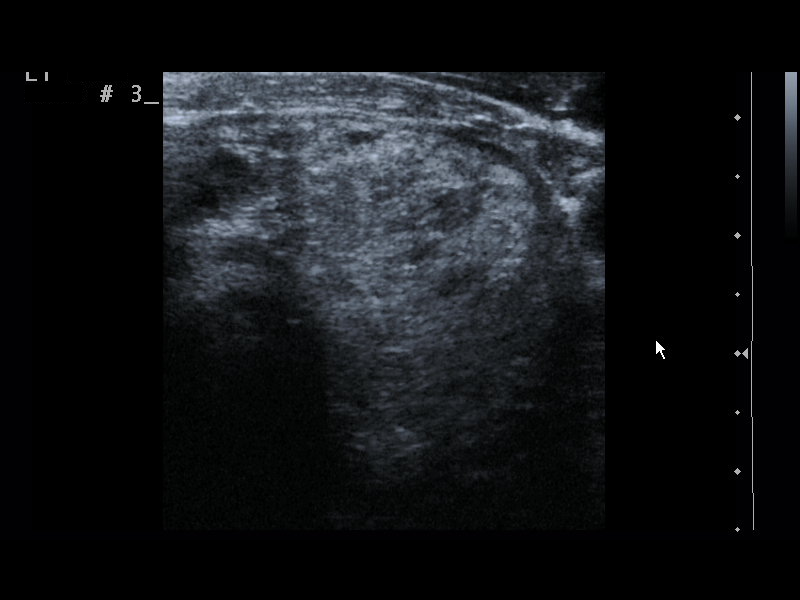
[im 4/8]
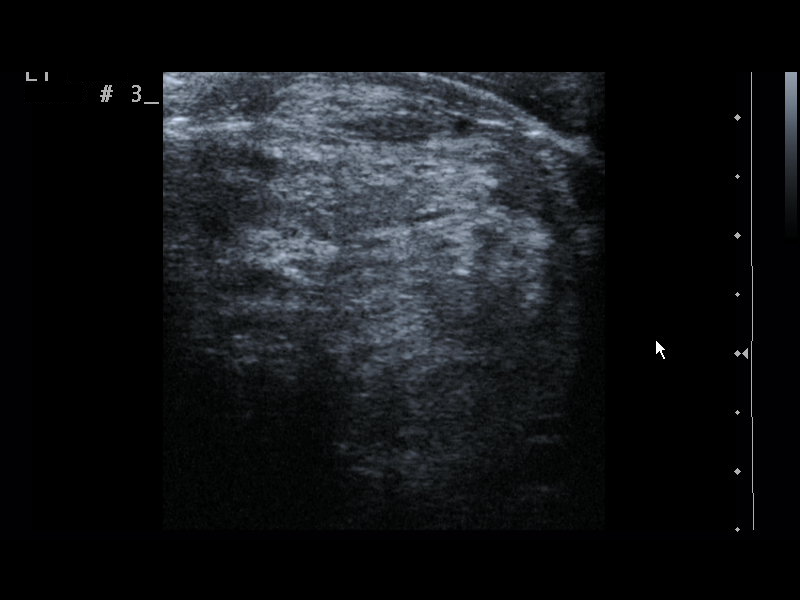
[im 5/8]
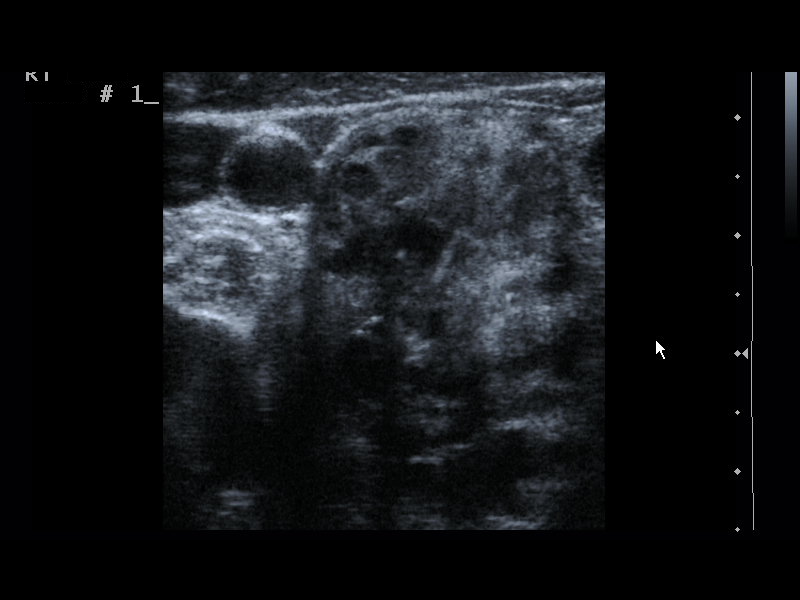
[im 6/8]
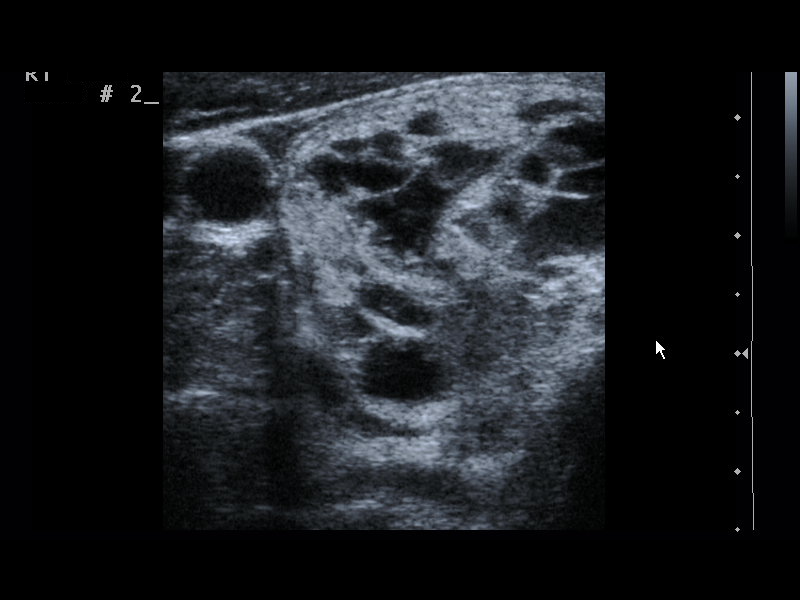
[im 7/8]
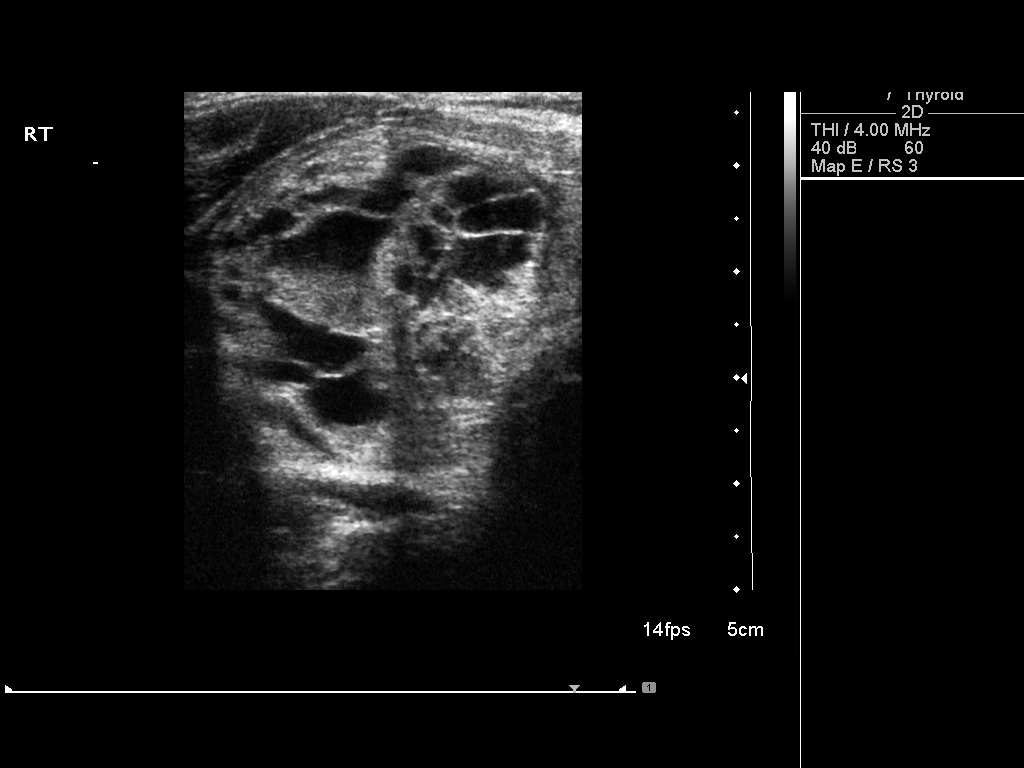
[im 8/8]
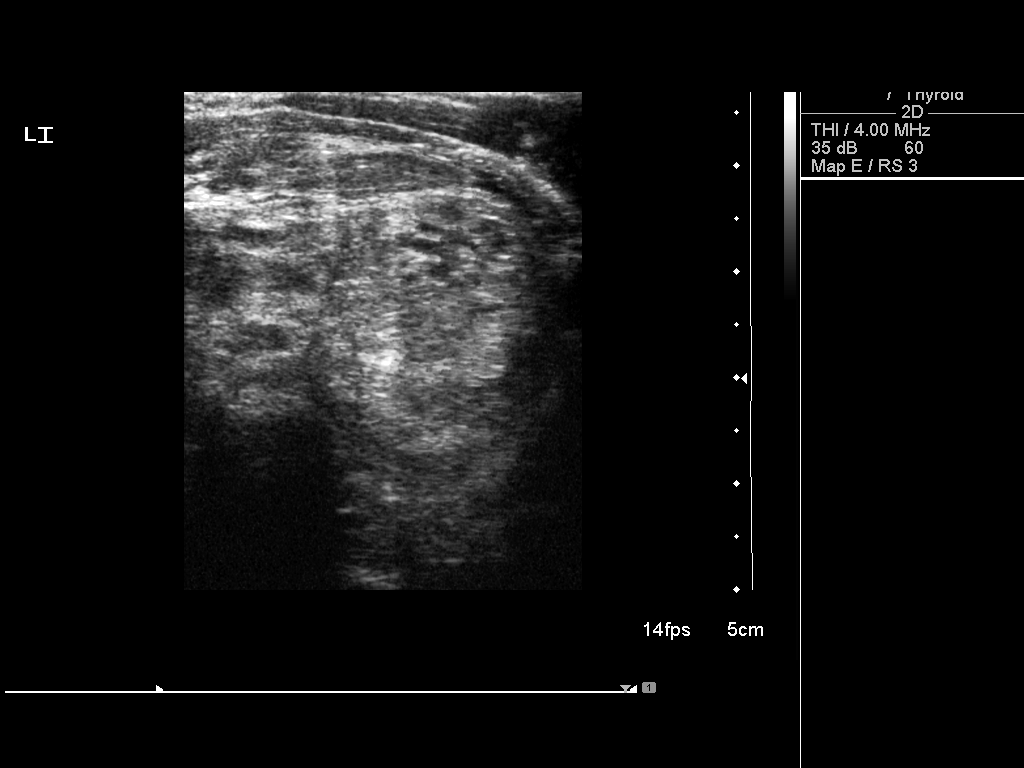

[8 of 8 positions shown; findings below may reference images not displayed]

The benefits, risks, alternatives, and
complications were also discussed.  The patient understands and
wishes to proceed with the procedure.  Written consent was
obtained.

Ultrasound was performed to localize and mark an adequate site for
the biopsy.  The patient was then prepped and draped in a normal
sterile fashion.  Local anesthesia was provided with 1% lidocaine.
Using direct ultrasound guidance, 4 passes were made using 25 gauge
needles into the nodule within the left lobe of the thyroid.
Ultrasound was used to confirm needle placements on all occasions.
Specimens were sent to Pathology for analysis.

Using direct ultrasound guidance, [4...] passes were made using  25
gauge ..] needles into the nodule within the right  lobe of the
thyroid.  Ultrasound was used to confirm needle placements on all
occasions.  Specimens were sent to Pathology for analysis.
FINDINGS: The thyroid tissue is diffusely enlarged and
heterogeneous.  Both lobes are replaced by nodular disease.
Needles were directed into the heterogeneous tissue bilaterally.

Complications:  None
IMPRESSION: Ultrasound guided needle aspirate biopsy performed of bilateral
thyroid tissue.

## 2014-04-15 IMAGING — CR DG CHEST 2V
2 series · 2 of 2 positions shown · non-contrast
Comparison: Thyroid gland ultrasound 04/26/2012 and chest x-ray
01/22/2010

CLINICAL DATA: Preop for posterior lumbar fusion

CHEST - 2 VIEW

[view not recorded (1 of 2)]
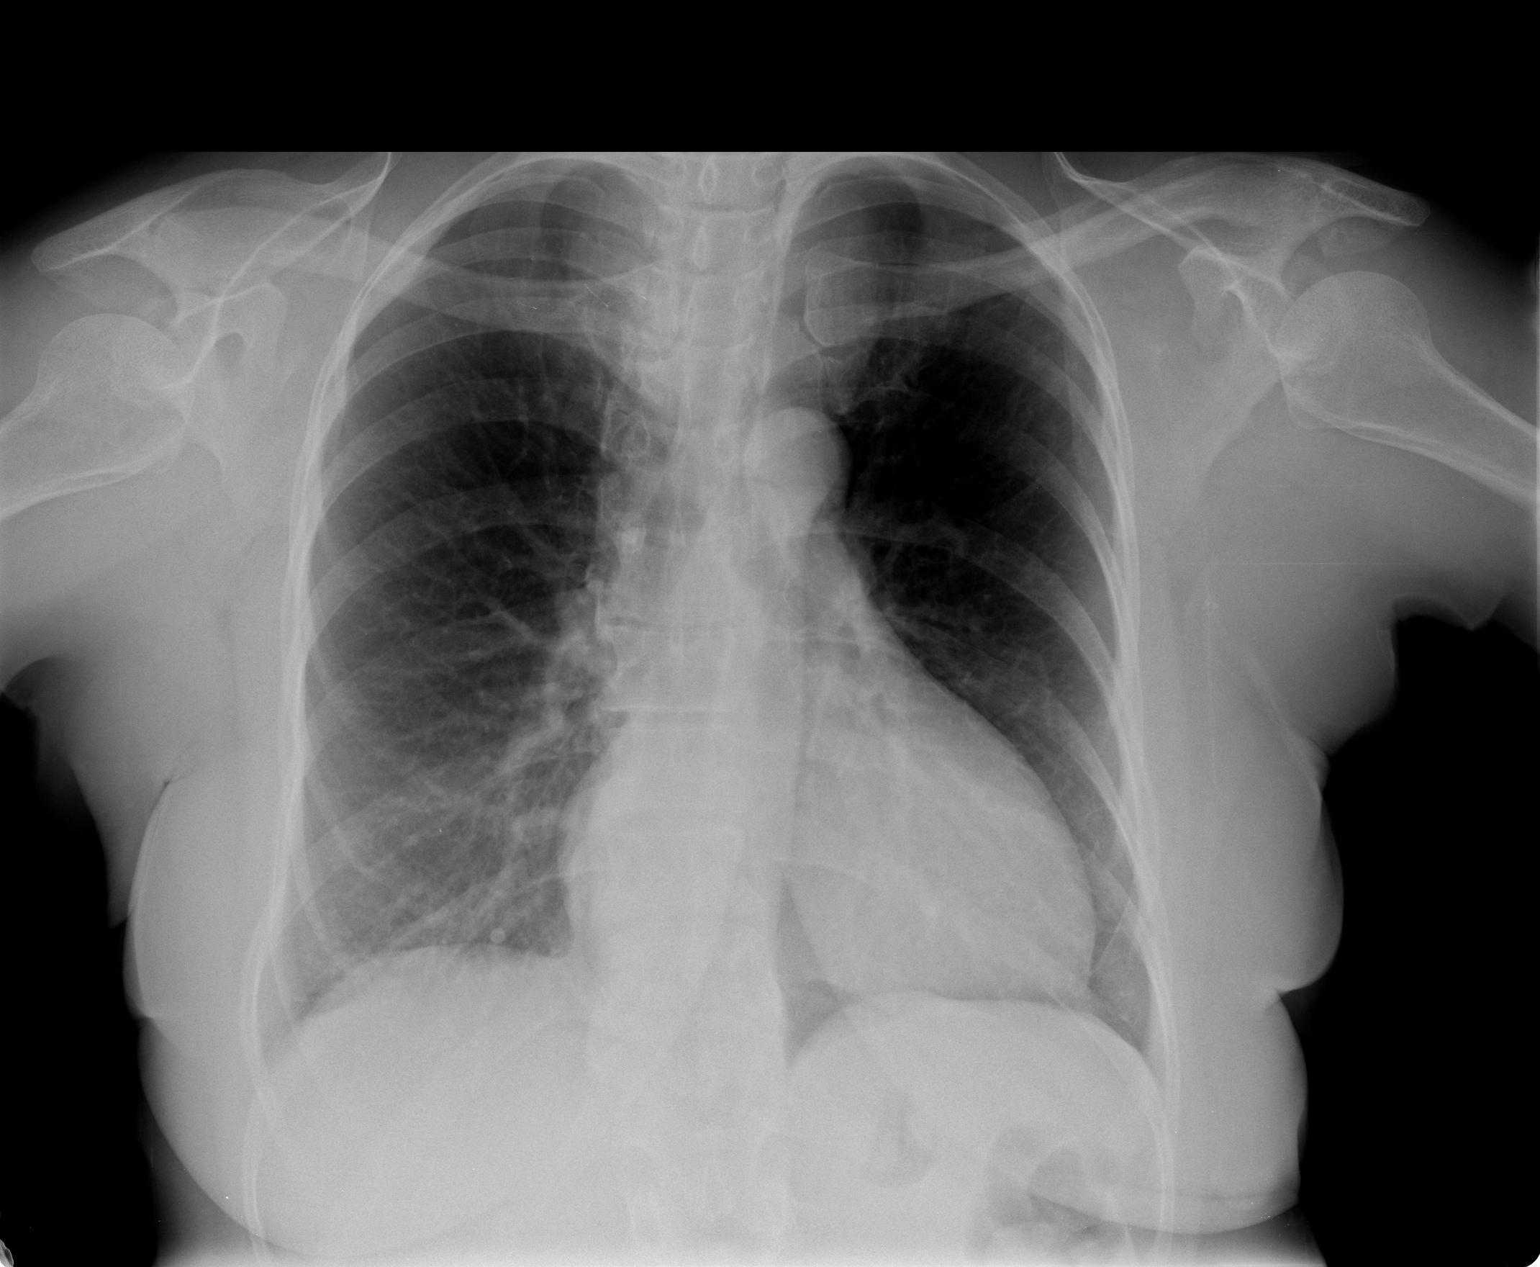

[view not recorded (2 of 2)]
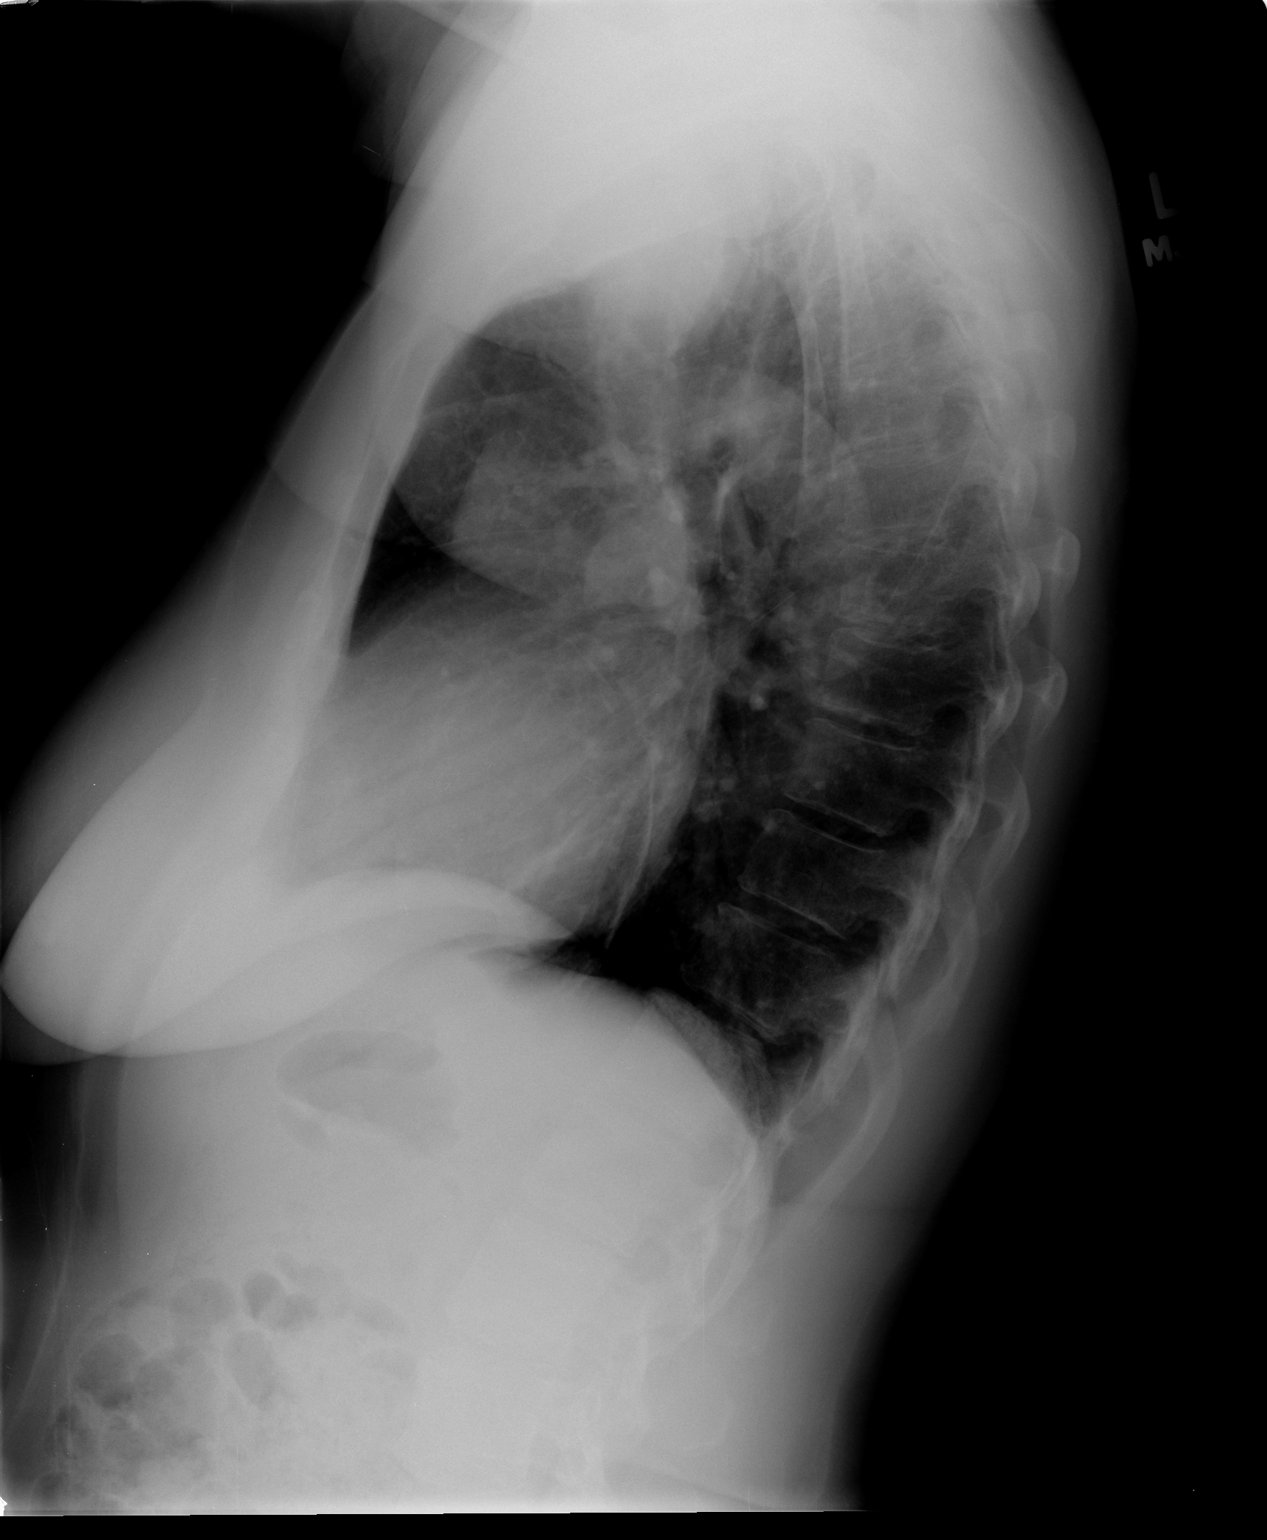

[2 of 2 positions shown; findings below may reference images not displayed]

FINDINGS: Borderline cardiomegaly noted.  No acute infiltrate or
pulmonary edema.  There is nodular soft tissue prominence in the
upper mediastinum most likely due to multinodular goiter.  Diffuse
enlarged thyroid gland was noted on thyroid ultrasound 04/26/2012.
Bony thorax is stable.
IMPRESSION: No active disease.  Soft tissue nodular prominence in the upper
mediastinum most likely due to multinodular goiter.

## 2015-02-19 ENCOUNTER — Other Ambulatory Visit (HOSPITAL_BASED_OUTPATIENT_CLINIC_OR_DEPARTMENT_OTHER): Payer: Self-pay | Admitting: Physician Assistant

## 2015-02-19 DIAGNOSIS — E042 Nontoxic multinodular goiter: Secondary | ICD-10-CM

## 2015-02-20 ENCOUNTER — Ambulatory Visit (HOSPITAL_BASED_OUTPATIENT_CLINIC_OR_DEPARTMENT_OTHER)
Admission: RE | Admit: 2015-02-20 | Discharge: 2015-02-20 | Disposition: A | Discharge status: Home / Self Care | Payer: Medicare Other | Source: Ambulatory Visit | Attending: Physician Assistant | Admitting: Physician Assistant

## 2015-02-20 DIAGNOSIS — E010 Iodine-deficiency related diffuse (endemic) goiter: Secondary | ICD-10-CM | POA: Diagnosis not present

## 2015-02-20 DIAGNOSIS — E042 Nontoxic multinodular goiter: Secondary | ICD-10-CM

## 2016-02-18 ENCOUNTER — Other Ambulatory Visit: Payer: Self-pay | Admitting: Physician Assistant

## 2016-02-18 ENCOUNTER — Other Ambulatory Visit (HOSPITAL_BASED_OUTPATIENT_CLINIC_OR_DEPARTMENT_OTHER): Payer: Self-pay | Admitting: Physician Assistant

## 2016-02-18 DIAGNOSIS — R1310 Dysphagia, unspecified: Secondary | ICD-10-CM

## 2016-02-18 DIAGNOSIS — E2839 Other primary ovarian failure: Secondary | ICD-10-CM

## 2016-03-10 ENCOUNTER — Other Ambulatory Visit: Payer: Self-pay | Admitting: Physician Assistant

## 2016-03-10 DIAGNOSIS — R1310 Dysphagia, unspecified: Secondary | ICD-10-CM

## 2016-03-21 ENCOUNTER — Ambulatory Visit
Admission: RE | Admit: 2016-03-21 | Discharge: 2016-03-21 | Disposition: A | Discharge status: Home / Self Care | Payer: Medicare Other | Source: Ambulatory Visit | Attending: Physician Assistant | Admitting: Physician Assistant

## 2016-03-21 DIAGNOSIS — R1310 Dysphagia, unspecified: Secondary | ICD-10-CM

## 2018-03-27 ENCOUNTER — Emergency Department (HOSPITAL_BASED_OUTPATIENT_CLINIC_OR_DEPARTMENT_OTHER)
Admission: EM | Admit: 2018-03-27 | Discharge: 2018-03-27 | Disposition: A | Discharge status: Home / Self Care | Payer: Medicare Other | Attending: Emergency Medicine | Admitting: Emergency Medicine

## 2018-03-27 ENCOUNTER — Other Ambulatory Visit: Payer: Self-pay

## 2018-03-27 ENCOUNTER — Emergency Department (HOSPITAL_BASED_OUTPATIENT_CLINIC_OR_DEPARTMENT_OTHER): Payer: Medicare Other

## 2018-03-27 ENCOUNTER — Encounter (HOSPITAL_BASED_OUTPATIENT_CLINIC_OR_DEPARTMENT_OTHER): Payer: Self-pay | Admitting: Emergency Medicine

## 2018-03-27 DIAGNOSIS — M25561 Pain in right knee: Secondary | ICD-10-CM | POA: Diagnosis not present

## 2018-03-27 DIAGNOSIS — Y92410 Unspecified street and highway as the place of occurrence of the external cause: Secondary | ICD-10-CM | POA: Diagnosis not present

## 2018-03-27 DIAGNOSIS — S92911A Unspecified fracture of right toe(s), initial encounter for closed fracture: Secondary | ICD-10-CM

## 2018-03-27 DIAGNOSIS — I10 Essential (primary) hypertension: Secondary | ICD-10-CM | POA: Insufficient documentation

## 2018-03-27 DIAGNOSIS — S99921A Unspecified injury of right foot, initial encounter: Secondary | ICD-10-CM | POA: Diagnosis present

## 2018-03-27 DIAGNOSIS — E039 Hypothyroidism, unspecified: Secondary | ICD-10-CM | POA: Insufficient documentation

## 2018-03-27 DIAGNOSIS — S92411A Displaced fracture of proximal phalanx of right great toe, initial encounter for closed fracture: Secondary | ICD-10-CM | POA: Insufficient documentation

## 2018-03-27 DIAGNOSIS — Z79899 Other long term (current) drug therapy: Secondary | ICD-10-CM | POA: Diagnosis not present

## 2018-03-27 DIAGNOSIS — Y998 Other external cause status: Secondary | ICD-10-CM | POA: Diagnosis not present

## 2018-03-27 DIAGNOSIS — Y9389 Activity, other specified: Secondary | ICD-10-CM | POA: Diagnosis not present

## 2018-03-27 DIAGNOSIS — M79642 Pain in left hand: Secondary | ICD-10-CM

## 2018-03-27 NOTE — Discharge Instructions (Signed)
Your images today showed evidence of a closed proximal first phalanx fracture of the great toe on the right foot.  Please use the shoe we provided and crutches to help minimize pain and weightbearing.  Please use the conservative pain strategies we discussed.  Please follow-up with orthopedics for further management.  If any symptoms change or worsen, please return to the nearest emergency department.Marland Kitchen
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
## 2018-03-27 NOTE — ED Notes (Signed)
Patient transported to X-ray 

## 2018-04-03 ENCOUNTER — Encounter: Payer: Self-pay | Admitting: Family Medicine

## 2018-04-03 ENCOUNTER — Ambulatory Visit (INDEPENDENT_AMBULATORY_CARE_PROVIDER_SITE_OTHER): Payer: Medicare Other | Admitting: Family Medicine

## 2018-04-03 DIAGNOSIS — S99921A Unspecified injury of right foot, initial encounter: Secondary | ICD-10-CM | POA: Diagnosis not present

## 2018-04-03 NOTE — Patient Instructions (Signed)
We will go ahead with a referral to a foot/ankle surgeon - this fragment involves about 30% of the joint space and is very displaced. It's still possible they watch this but I'm concerned it will need to be operated on. Cam walker when up and walking around. Use crutches as needed. Tylenol, aleve if needed for pain. Icing, elevation.
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
## 2018-04-03 NOTE — Assessment & Plan Note (Signed)
independently reviewed radiographs and noted displaced intraarticular proximal phalanx fracture involving about 25-30% of the joint.  Advised we go ahead with ortho referral to discuss possible ORIF.  Switch to cam walker.  Crutches if needed.  Tylenol, aleve if needed, icing, elevation.

## 2020-08-31 ENCOUNTER — Other Ambulatory Visit (HOSPITAL_COMMUNITY): Payer: Self-pay | Admitting: Nurse Practitioner

## 2020-08-31 DIAGNOSIS — R011 Cardiac murmur, unspecified: Secondary | ICD-10-CM

## 2020-10-01 ENCOUNTER — Other Ambulatory Visit (HOSPITAL_COMMUNITY): Payer: Medicare Other

## 2020-10-02 DIAGNOSIS — Z4689 Encounter for fitting and adjustment of other specified devices: Secondary | ICD-10-CM | POA: Diagnosis not present

## 2020-10-02 DIAGNOSIS — N8111 Cystocele, midline: Secondary | ICD-10-CM | POA: Diagnosis not present

## 2020-10-02 DIAGNOSIS — N816 Rectocele: Secondary | ICD-10-CM | POA: Diagnosis not present

## 2020-10-13 DIAGNOSIS — M810 Age-related osteoporosis without current pathological fracture: Secondary | ICD-10-CM | POA: Diagnosis not present

## 2020-10-13 DIAGNOSIS — H9190 Unspecified hearing loss, unspecified ear: Secondary | ICD-10-CM | POA: Diagnosis not present

## 2020-10-13 DIAGNOSIS — Z136 Encounter for screening for cardiovascular disorders: Secondary | ICD-10-CM | POA: Diagnosis not present

## 2020-10-13 DIAGNOSIS — Z131 Encounter for screening for diabetes mellitus: Secondary | ICD-10-CM | POA: Diagnosis not present

## 2020-10-13 DIAGNOSIS — Z0000 Encounter for general adult medical examination without abnormal findings: Secondary | ICD-10-CM | POA: Diagnosis not present

## 2020-10-13 DIAGNOSIS — I10 Essential (primary) hypertension: Secondary | ICD-10-CM | POA: Diagnosis not present

## 2020-10-13 DIAGNOSIS — E039 Hypothyroidism, unspecified: Secondary | ICD-10-CM | POA: Diagnosis not present

## 2020-10-14 DIAGNOSIS — R7303 Prediabetes: Secondary | ICD-10-CM | POA: Diagnosis not present

## 2020-10-22 ENCOUNTER — Other Ambulatory Visit: Payer: Self-pay

## 2020-10-22 ENCOUNTER — Ambulatory Visit (HOSPITAL_COMMUNITY): Payer: Medicare Other | Attending: Nurse Practitioner

## 2020-10-22 DIAGNOSIS — R011 Cardiac murmur, unspecified: Secondary | ICD-10-CM | POA: Diagnosis not present

## 2020-10-22 LAB — ECHOCARDIOGRAM COMPLETE
Area-P 1/2: 2.39 cm2
S' Lateral: 2.6 cm

## 2020-11-27 DIAGNOSIS — H903 Sensorineural hearing loss, bilateral: Secondary | ICD-10-CM | POA: Diagnosis not present

## 2020-11-30 ENCOUNTER — Encounter: Payer: Self-pay | Admitting: Cardiovascular Disease

## 2020-11-30 ENCOUNTER — Ambulatory Visit: Payer: Medicare Other | Admitting: Cardiovascular Disease

## 2020-11-30 ENCOUNTER — Other Ambulatory Visit: Payer: Self-pay

## 2020-11-30 VITALS — BP 122/68 | HR 80 | Ht 66.0 in | Wt 155.6 lb

## 2020-11-30 DIAGNOSIS — I358 Other nonrheumatic aortic valve disorders: Secondary | ICD-10-CM | POA: Insufficient documentation

## 2020-11-30 DIAGNOSIS — I350 Nonrheumatic aortic (valve) stenosis: Secondary | ICD-10-CM

## 2020-11-30 NOTE — Patient Instructions (Signed)
Medication Instructions:  Your physician recommends that you continue on your current medications as directed. Please refer to the Current Medication list given to you today.  *If you need a refill on your cardiac medications before your next appointment, please call your pharmacy*   Lab Work: none If you have labs (blood work) drawn today and your tests are completely normal, you will receive your results only by: Marland Kitchen MyChart Message (if you have MyChart) OR . A paper copy in the mail If you have any lab test that is abnormal or we need to change your treatment, we will call you to review the results.   Testing/Procedures: none   Follow-Up: At [facility], you and your health needs are our priority.  As part of our continuing mission to provide you with exceptional heart care, we have created designated Provider Care Teams.  These Care Teams include your primary Cardiologist (physician) and Advanced Practice Providers (APPs -  Physician Assistants and Nurse Practitioners) who all work together to provide you with the care you need, when you need it.  We recommend signing up for the patient portal called "MyChart".  Sign up information is provided on this After Visit Summary.  MyChart is used to connect with patients for Virtual Visits (Telemedicine).  Patients are able to view lab/test results, encounter notes, upcoming appointments, etc.  Non-urgent messages can be sent to your provider as well.   To learn more about what you can do with MyChart, go to [email or web address].    Your next appointment:    as needed  The format for your next appointment:   In Person  Provider:   You may see Dr. Acie Fredrickson or one of the following Advanced Practice Providers on your designated Care Team:    Richardson Dopp, PA-C  [place], [place]

## 2020-11-30 NOTE — Progress Notes (Signed)
Cardiology Office Note:    Date:  11/30/2020   ID:  Sheila Boyd, DOB 19-Dec-1945, MRN [id number]  PCP:  Orpah Melter, MD   [facility]  Cardiologist: Therron Sells  Advanced Practice Provider:  No care team member to display Electrophysiologist:  None     Referring MD: Carolee Rota, NP   Chief Complaint  Patient presents with  . Heart Murmur    November 30, 2020:    Sheila Boyd is a 75 y.o. female with a hx of HTN, hypothyrodism.  [name] found to a heart murmur  Originally from [place],  We were asked to see her by Synthia Innocent, NP for further eval of her heart murmur .   Walks rarely ,  No syncope   Echocardiogram from October 22, 2020 shows normal left ventricular systolic function.  She has grade 1 diastolic dysfunction.   There is trivial mitral regurgitation. There is aortic valve sclerosis but no evidence of aortic stenosis. There is trivial tricuspid regurgitation.  Past Medical History:  Diagnosis Date  . Arthritis   . GERD (gastroesophageal reflux disease)   . Hypertension   . Hypothyroidism     Past Surgical History:  Procedure Laterality Date  . ABDOMINAL HYSTERECTOMY    . BACK SURGERY    . BREAST SURGERY     benign  lumps removed  . FOOT SURGERY     right foot  -- pin placed  . POSTERIOR FUSION LUMBAR SPINE  06/11/2012    Current Medications: Current Meds  Medication Sig  . alendronate (FOSAMAX) 70 MG tablet Take 70 mg by mouth once a week.  Marland Kitchen amLODipine (NORVASC) 2.5 MG tablet Take 2.5 mg by mouth 2 (two) times daily.  . Cholecalciferol (VITAMIN D) 50 MCG (2000 UT) tablet Take 1 tablet by mouth daily.  . cyanocobalamin 1000 MCG tablet Take 1 tablet by mouth daily.  . Garlic 4627 MG CAPS Take 1 capsule by mouth daily.  Marland Kitchen levothyroxine (SYNTHROID) 25 MCG tablet Take 0.5 tablets by mouth daily.  Marland Kitchen loratadine (CLARITIN) 10 MG tablet Take 1 tablet by mouth as needed.  . vitamin C (ASCORBIC ACID) 250 MG tablet Take 250  mg by mouth daily. gummies  . Zinc 50 MG CAPS Take 1 capsule by mouth daily.  . [DISCONTINUED] levothyroxine (SYNTHROID, LEVOTHROID) 25 MCG tablet Take 25 mcg by mouth daily.     Allergies:   Penicillins   Social History   Socioeconomic History  . Marital status: Widowed    Spouse name: Not on file  . Number of children: Not on file  . Years of education: Not on file  . Highest education level: Not on file  Occupational History  . Not on file  Tobacco Use  . Smoking status: Never Smoker  . Smokeless tobacco: Never Used  Substance and Sexual Activity  . Alcohol use: No  . Drug use: No  . Sexual activity: Never    Birth control/protection: Surgical  Other Topics Concern  . Not on file  Social History Narrative  . Not on file   Social Determinants of Health   Financial Resource Strain: Not on file  Food Insecurity: Not on file  Transportation Needs: Not on file  Physical Activity: Not on file  Stress: Not on file  Social Connections: Not on file     Family History: The patient's family history includes Hypertension in her mother.  ROS:   Please see the history of present illness.  All other systems reviewed and are negative.  EKGs/Labs/Other Studies Reviewed:    The following studies were reviewed today:   EKG:    November 30, 2020:  NSR  TWI in ant leads No changes from previous study   Recent Labs: No results found for requested labs within last 8760 hours.  Recent Lipid Panel No results found for: CHOL, TRIG, HDL, CHOLHDL, VLDL, LDLCALC, LDLDIRECT   Risk Assessment/Calculations:       Physical Exam:    VS:  BP 122/68   Pulse 80   Ht 5\' 6"  (1.676 m)   Wt 155 lb 9.6 oz (70.6 kg)   SpO2 98%   BMI 25.11 kg/m     Wt Readings from Last 3 Encounters:  11/30/20 155 lb 9.6 oz (70.6 kg)  04/03/18 156 lb (70.8 kg)  03/27/18 155 lb (70.3 kg)     GEN:  Well nourished, well developed in no acute distress HEENT: Normal NECK: No JVD; No carotid  bruits LYMPHATICS: No lymphadenopathy CARDIAC: RRR  Soft systolic murmur  RESPIRATORY:  Clear to auscultation without rales, wheezing or rhonchi  ABDOMEN: Soft, non-tender, non-distended MUSCULOSKELETAL:  No edema; No deformity  SKIN: Warm and dry NEUROLOGIC:  Alert and oriented x 3 PSYCHIATRIC:  Normal affect   ASSESSMENT:    1. Aortic stenosis, mild    PLAN:    In order of problems listed above:  1. Aortic sclerosis :     Nyoka presents for further eval of ehr Ao sclerosis.  Echo from last month shows no significant AV disease, trivial MR, trivial TR   [name] asymptomatic .    She will not need routing followup but may see [place] on an as needed basis         Medication Adjustments/Labs and Tests Ordered: Current medicines are reviewed at length with the patient today.  Concerns regarding medicines are outlined above.  Orders Placed This Encounter  Procedures  . EKG 12-Lead   No orders of the defined types were placed in this encounter.   Patient Instructions  Medication Instructions:  Your physician recommends that you continue on your current medications as directed. Please refer to the Current Medication list given to you today.  *If you need a refill on your cardiac medications before your next appointment, please call your pharmacy*   Lab Work: none If you have labs (blood work) drawn today and your tests are completely normal, you will receive your results only by: Marland Kitchen MyChart Message (if you have MyChart) OR . A paper copy in the mail If you have any lab test that is abnormal or we need to change your treatment, we will call you to review the results.   Testing/Procedures: none   Follow-Up: At [facility], you and your health needs are our priority.  As part of our continuing mission to provide you with exceptional heart care, we have created designated Provider Care Teams.  These Care Teams include your primary Cardiologist (physician) and Advanced  Practice Providers (APPs -  Physician Assistants and Nurse Practitioners) who all work together to provide you with the care you need, when you need it.  We recommend signing up for the patient portal called "MyChart".  Sign up information is provided on this After Visit Summary.  MyChart is used to connect with patients for Virtual Visits (Telemedicine).  Patients are able to view lab/test results, encounter notes, upcoming appointments, etc.  Non-urgent messages can be sent to your provider as well.  To learn more about what you can do with MyChart, go to [email or web address].    Your next appointment:    as needed  The format for your next appointment:   In Person  Provider:   You may see Dr. Acie Fredrickson or one of the following Advanced Practice Providers on your designated Care Team:    Richardson Dopp, PA-C  [place], [place]       Signed, Mertie Moores, MD  11/30/2020 2:26 PM    [place]

## 2021-01-01 DIAGNOSIS — Z4689 Encounter for fitting and adjustment of other specified devices: Secondary | ICD-10-CM | POA: Diagnosis not present

## 2021-01-01 DIAGNOSIS — N816 Rectocele: Secondary | ICD-10-CM | POA: Diagnosis not present

## 2021-01-01 DIAGNOSIS — N8111 Cystocele, midline: Secondary | ICD-10-CM | POA: Diagnosis not present

## 2021-01-13 DIAGNOSIS — U071 COVID-19: Secondary | ICD-10-CM | POA: Diagnosis not present

## 2021-01-13 DIAGNOSIS — Z20822 Contact with and (suspected) exposure to covid-19: Secondary | ICD-10-CM | POA: Diagnosis not present

## 2021-01-29 DIAGNOSIS — R109 Unspecified abdominal pain: Secondary | ICD-10-CM | POA: Diagnosis not present

## 2021-02-24 DIAGNOSIS — K449 Diaphragmatic hernia without obstruction or gangrene: Secondary | ICD-10-CM | POA: Diagnosis not present

## 2021-02-24 DIAGNOSIS — R1013 Epigastric pain: Secondary | ICD-10-CM | POA: Diagnosis not present

## 2021-03-08 DIAGNOSIS — M85851 Other specified disorders of bone density and structure, right thigh: Secondary | ICD-10-CM | POA: Diagnosis not present

## 2021-03-08 DIAGNOSIS — M85852 Other specified disorders of bone density and structure, left thigh: Secondary | ICD-10-CM | POA: Diagnosis not present

## 2021-03-08 DIAGNOSIS — Z1231 Encounter for screening mammogram for malignant neoplasm of breast: Secondary | ICD-10-CM | POA: Diagnosis not present

## 2021-03-17 DIAGNOSIS — K5732 Diverticulitis of large intestine without perforation or abscess without bleeding: Secondary | ICD-10-CM | POA: Diagnosis not present

## 2021-04-05 DIAGNOSIS — Z4689 Encounter for fitting and adjustment of other specified devices: Secondary | ICD-10-CM | POA: Diagnosis not present

## 2021-04-05 DIAGNOSIS — N816 Rectocele: Secondary | ICD-10-CM | POA: Diagnosis not present

## 2021-04-05 DIAGNOSIS — N8111 Cystocele, midline: Secondary | ICD-10-CM | POA: Diagnosis not present

## 2021-05-27 DIAGNOSIS — K5790 Diverticulosis of intestine, part unspecified, without perforation or abscess without bleeding: Secondary | ICD-10-CM | POA: Diagnosis not present

## 2021-05-27 DIAGNOSIS — Z1211 Encounter for screening for malignant neoplasm of colon: Secondary | ICD-10-CM | POA: Diagnosis not present

## 2021-05-27 DIAGNOSIS — K449 Diaphragmatic hernia without obstruction or gangrene: Secondary | ICD-10-CM | POA: Diagnosis not present

## 2021-05-27 DIAGNOSIS — R1013 Epigastric pain: Secondary | ICD-10-CM | POA: Diagnosis not present

## 2021-07-05 DIAGNOSIS — N8111 Cystocele, midline: Secondary | ICD-10-CM | POA: Diagnosis not present

## 2021-07-05 DIAGNOSIS — N816 Rectocele: Secondary | ICD-10-CM | POA: Diagnosis not present

## 2021-07-05 DIAGNOSIS — Z4689 Encounter for fitting and adjustment of other specified devices: Secondary | ICD-10-CM | POA: Diagnosis not present

## 2021-07-13 DIAGNOSIS — J208 Acute bronchitis due to other specified organisms: Secondary | ICD-10-CM | POA: Diagnosis not present

## 2021-07-13 DIAGNOSIS — B9689 Other specified bacterial agents as the cause of diseases classified elsewhere: Secondary | ICD-10-CM | POA: Diagnosis not present

## 2021-07-13 DIAGNOSIS — Z20822 Contact with and (suspected) exposure to covid-19: Secondary | ICD-10-CM | POA: Diagnosis not present

## 2021-09-02 DIAGNOSIS — R221 Localized swelling, mass and lump, neck: Secondary | ICD-10-CM | POA: Diagnosis not present

## 2021-09-22 DIAGNOSIS — K2930 Chronic superficial gastritis without bleeding: Secondary | ICD-10-CM | POA: Diagnosis not present

## 2021-09-22 DIAGNOSIS — B9681 Helicobacter pylori [H. pylori] as the cause of diseases classified elsewhere: Secondary | ICD-10-CM | POA: Diagnosis not present

## 2021-09-22 DIAGNOSIS — K648 Other hemorrhoids: Secondary | ICD-10-CM | POA: Diagnosis not present

## 2021-09-22 DIAGNOSIS — Z1211 Encounter for screening for malignant neoplasm of colon: Secondary | ICD-10-CM | POA: Diagnosis not present

## 2021-09-22 DIAGNOSIS — D122 Benign neoplasm of ascending colon: Secondary | ICD-10-CM | POA: Diagnosis not present

## 2021-09-22 DIAGNOSIS — D128 Benign neoplasm of rectum: Secondary | ICD-10-CM | POA: Diagnosis not present

## 2021-09-22 DIAGNOSIS — K5730 Diverticulosis of large intestine without perforation or abscess without bleeding: Secondary | ICD-10-CM | POA: Diagnosis not present

## 2021-09-22 DIAGNOSIS — R1013 Epigastric pain: Secondary | ICD-10-CM | POA: Diagnosis not present

## 2021-09-29 DIAGNOSIS — K2930 Chronic superficial gastritis without bleeding: Secondary | ICD-10-CM | POA: Diagnosis not present

## 2021-09-29 DIAGNOSIS — D122 Benign neoplasm of ascending colon: Secondary | ICD-10-CM | POA: Diagnosis not present

## 2021-09-29 DIAGNOSIS — D128 Benign neoplasm of rectum: Secondary | ICD-10-CM | POA: Diagnosis not present

## 2021-09-29 DIAGNOSIS — B9681 Helicobacter pylori [H. pylori] as the cause of diseases classified elsewhere: Secondary | ICD-10-CM | POA: Diagnosis not present

## 2021-10-08 DIAGNOSIS — D485 Neoplasm of uncertain behavior of skin: Secondary | ICD-10-CM | POA: Diagnosis not present

## 2021-10-22 DIAGNOSIS — Z13220 Encounter for screening for lipoid disorders: Secondary | ICD-10-CM | POA: Diagnosis not present

## 2021-10-22 DIAGNOSIS — Z0000 Encounter for general adult medical examination without abnormal findings: Secondary | ICD-10-CM | POA: Diagnosis not present

## 2021-10-22 DIAGNOSIS — Z136 Encounter for screening for cardiovascular disorders: Secondary | ICD-10-CM | POA: Diagnosis not present

## 2021-10-22 DIAGNOSIS — E039 Hypothyroidism, unspecified: Secondary | ICD-10-CM | POA: Diagnosis not present

## 2021-10-22 DIAGNOSIS — I10 Essential (primary) hypertension: Secondary | ICD-10-CM | POA: Diagnosis not present

## 2021-10-25 DIAGNOSIS — N816 Rectocele: Secondary | ICD-10-CM | POA: Diagnosis not present

## 2021-10-25 DIAGNOSIS — N8111 Cystocele, midline: Secondary | ICD-10-CM | POA: Diagnosis not present

## 2021-10-25 DIAGNOSIS — Z4689 Encounter for fitting and adjustment of other specified devices: Secondary | ICD-10-CM | POA: Diagnosis not present

## 2021-11-25 DIAGNOSIS — B9681 Helicobacter pylori [H. pylori] as the cause of diseases classified elsewhere: Secondary | ICD-10-CM | POA: Diagnosis not present

## 2022-02-16 DIAGNOSIS — A048 Other specified bacterial intestinal infections: Secondary | ICD-10-CM | POA: Diagnosis not present

## 2022-02-16 DIAGNOSIS — R531 Weakness: Secondary | ICD-10-CM | POA: Diagnosis not present

## 2022-02-21 DIAGNOSIS — N8110 Cystocele, unspecified: Secondary | ICD-10-CM | POA: Diagnosis not present

## 2022-02-21 DIAGNOSIS — Z4689 Encounter for fitting and adjustment of other specified devices: Secondary | ICD-10-CM | POA: Diagnosis not present

## 2022-02-21 DIAGNOSIS — N816 Rectocele: Secondary | ICD-10-CM | POA: Diagnosis not present

## 2022-02-21 DIAGNOSIS — A048 Other specified bacterial intestinal infections: Secondary | ICD-10-CM | POA: Diagnosis not present

## 2022-03-09 ENCOUNTER — Other Ambulatory Visit: Payer: Self-pay

## 2022-03-09 ENCOUNTER — Encounter: Payer: Self-pay | Admitting: Infectious Disease

## 2022-03-09 ENCOUNTER — Ambulatory Visit: Payer: Medicare Other | Admitting: Infectious Disease

## 2022-03-09 VITALS — BP 152/79 | HR 97 | Temp 97.6°F | Ht 65.5 in | Wt 148.0 lb

## 2022-03-09 DIAGNOSIS — A048 Other specified bacterial intestinal infections: Secondary | ICD-10-CM | POA: Diagnosis not present

## 2022-03-09 DIAGNOSIS — Z880 Allergy status to penicillin: Secondary | ICD-10-CM

## 2022-03-09 HISTORY — DX: Allergy status to penicillin: Z88.0

## 2022-03-09 HISTORY — DX: Other specified bacterial intestinal infections: A04.8

## 2022-03-09 NOTE — Progress Notes (Signed)
Subjective:  Reason for disease consult: Refractory Helicobacter pylori infection  Requesting physician: Christella Noa, MD   Patient ID: Sheila Boyd, female    DOB: 1946-04-26, 76 y.o.   MRN: [id number]  HPI  Kitara is a very lovely [place] born lady who has a past medical history significant for hypothyroidism hypertension and seasonal allergies, along with a remote penicillin allergy who was diagnosed with Helicobacter pylori via EGD in January 2023.  This was done with [facility].  Note I do not have records from [place] beyond the endoscopy reports including upper and lower endoscopy but we were able to endeavor through talking to [facility] and through review of dispensing history to see that she was given a regimen of clarithromycin doxycycline and bismuth on October 06, 2021 and then retreated with levofloxacin metronidazole and Protonix in March 2023.  I do not have records of the repeat testing that proved that she still had this but since she was referred to [place] to assume that this was proven and she says that it was Found on stool antigen testing.  In questioning her with regards to her penicillin allergy she said that she had septicemia due to retained placenta after the birth of one of her children.  She was taken to the operating room and upon return from the OR and eventual extubation she was told by the surgeon that she was allergic to penicillin and was not to take it ever again.  She herself does not know what the nature of the reaction was.  She does not recall having a rash.  Terms of symptomatology seems seems to have some abdominal discomfort that is ongoing and likely related to the H. pylori infection.  She has had trouble tolerating antibiotics for most of her life.  Past Medical History:  Diagnosis Date   Arthritis    GERD (gastroesophageal reflux disease)    Helicobacter pylori (H. pylori) infection 03/09/2022   Hypertension    Hypothyroidism    Penicillin  allergy 03/09/2022    Past Surgical History:  Procedure Laterality Date   ABDOMINAL HYSTERECTOMY     BACK SURGERY     BREAST SURGERY     benign  lumps removed   FOOT SURGERY     right foot  -- pin placed   POSTERIOR FUSION LUMBAR SPINE  06/11/2012    Family History  Problem Relation Age of Onset   Hypertension Mother       Social History   Socioeconomic History   Marital status: Widowed    Spouse name: Not on file   Number of children: Not on file   Years of education: Not on file   Highest education level: Not on file  Occupational History   Not on file  Tobacco Use   Smoking status: Never   Smokeless tobacco: Never  Substance and Sexual Activity   Alcohol use: No   Drug use: No   Sexual activity: Never    Birth control/protection: Surgical  Other Topics Concern   Not on file  Social History Narrative   Not on file   Social Determinants of Health   Financial Resource Strain: Not on file  Food Insecurity: Not on file  Transportation Needs: Not on file  Physical Activity: Not on file  Stress: Not on file  Social Connections: Not on file    Allergies  Allergen Reactions   Penicillins Other (See Comments)    Unknown reaction.     Current Outpatient Medications:  alendronate (FOSAMAX) 70 MG tablet, Take 70 mg by mouth once a week., Disp: , Rfl: 3   amLODipine (NORVASC) 2.5 MG tablet, Take 2.5 mg by mouth 2 (two) times daily., Disp: , Rfl:    Cholecalciferol (VITAMIN D) 50 MCG (2000 UT) tablet, Take 1 tablet by mouth daily., Disp: , Rfl:    cyanocobalamin 1000 MCG tablet, Take 1 tablet by mouth daily., Disp: , Rfl:    Garlic 6761 MG CAPS, Take 1 capsule by mouth daily., Disp: , Rfl:    levothyroxine (SYNTHROID) 25 MCG tablet, Take 0.5 tablets by mouth daily., Disp: , Rfl:    loratadine (CLARITIN) 10 MG tablet, Take 1 tablet by mouth as needed., Disp: , Rfl:    Zinc 50 MG CAPS, Take 1 capsule by mouth daily., Disp: , Rfl:    ascorbic acid (VITAMIN C) 100  MG tablet, Take by mouth. (Patient not taking: Reported on 03/09/2022), Disp: , Rfl:    vitamin C (ASCORBIC ACID) 250 MG tablet, Take 250 mg by mouth daily. gummies (Patient not taking: Reported on 03/09/2022), Disp: , Rfl:    Review of Systems  Constitutional:  Negative for chills and fever.  HENT:  Negative for congestion and sore throat.   Eyes:  Negative for photophobia.  Respiratory:  Negative for cough, shortness of breath and wheezing.   Cardiovascular:  Negative for chest pain, palpitations and leg swelling.  Gastrointestinal:  Positive for abdominal pain. Negative for blood in stool, constipation, diarrhea, nausea and vomiting.  Genitourinary:  Negative for dysuria, flank pain and hematuria.  Musculoskeletal:  Negative for back pain and myalgias.  Skin:  Negative for rash.  Neurological:  Negative for dizziness, weakness and headaches.  Hematological:  Does not bruise/bleed easily.  Psychiatric/Behavioral:  Negative for suicidal ideas.        Objective:   Physical Exam Constitutional:      General: [name] not in acute distress.    Appearance: Normal appearance. [name] well-developed. [name] not ill-appearing or diaphoretic.  HENT:     Head: Normocephalic and atraumatic.     Right Ear: Hearing and external ear normal.     Left Ear: Hearing and external ear normal.     Nose: No nasal deformity or rhinorrhea.  Eyes:     General: No scleral icterus.    Conjunctiva/sclera: Conjunctivae normal.     Right eye: Right conjunctiva is not injected.     Left eye: Left conjunctiva is not injected.     Pupils: Pupils are equal, round, and reactive to light.  Neck:     Vascular: No JVD.  Cardiovascular:     Rate and Rhythm: Normal rate and regular rhythm.     Heart sounds: S1 normal and S2 normal.  Pulmonary:     Effort: Pulmonary effort is normal. No respiratory distress.     Breath sounds: No wheezing.  Abdominal:     General: There is no distension.  Musculoskeletal:         General: Normal range of motion.     Right shoulder: Normal.     Left shoulder: Normal.     Cervical back: Normal range of motion and neck supple.     Right hip: Normal.     Left hip: Normal.     Right knee: Normal.     Left knee: Normal.  Lymphadenopathy:     Head:     Right side of head: No submandibular, preauricular or posterior auricular adenopathy.  Left side of head: No submandibular, preauricular or posterior auricular adenopathy.     Cervical: No cervical adenopathy.     Right cervical: No superficial or deep cervical adenopathy.    Left cervical: No superficial or deep cervical adenopathy.  Skin:    General: Skin is warm and dry.     Coloration: Skin is not pale.     Findings: No abrasion, bruising, ecchymosis, erythema, lesion or rash.     Nails: There is no clubbing.  Neurological:     Mental Status: [name] alert and oriented to person, place, and time.     Sensory: No sensory deficit.     Coordination: Coordination normal.     Gait: Gait normal.  Psychiatric:        Attention and Perception: [name] attentive.        Mood and Affect: Mood normal.        Speech: Speech normal.        Behavior: Behavior normal. Behavior is cooperative.        Thought Content: Thought content normal.        Judgment: Judgment normal.           Assessment & Plan:  Refractory Helicobacter pylori infection:  I would like to first determine if she truly has penicillin allergy before proceeding with a choice of salvage therapy.  Penicillin allergy:  Not clear whether she actually has a penicillin allergy or whether she has something such as anaphylaxis with penicillin.  Fact that she was septic at the time that this diagnosis was made is certainly a likely confounder but I also have no way of obtaining records from 50 years ago from [place].  I am referring her to allergy and immunology to test her for penicillin reactivity.  Hopefully she does not show reactivity and we can  then give her an oral amoxicillin challenge in the clinic with close observation and epinephrine and Benadryl on hand.  If she is not allergic I would like to proceed with an amoxicillin rifabutin and PPI based therapy for her H. pylori infection.  I spent 84 minutes with the patient including than 50% of the time in face to face counseling of the patient the nature of H. pylori infection, along with review of medical records in preparation for the visit and during the visit and in coordination of her care.

## 2022-03-10 DIAGNOSIS — Z1231 Encounter for screening mammogram for malignant neoplasm of breast: Secondary | ICD-10-CM | POA: Diagnosis not present

## 2022-04-11 ENCOUNTER — Ambulatory Visit: Payer: Medicare Other | Admitting: Infectious Disease

## 2022-04-25 NOTE — Progress Notes (Unsigned)
New Patient Note  RE: Sheila Boyd MRN: [id number] DOB: [birth year] Date of Office Visit: 04/26/2022  Consult requested by: Tommy Medal, Lavell Islam, MD Primary care provider: Orpah Melter, MD  Chief Complaint: No chief complaint on file.  History of Present Illness: I had the pleasure of seeing Sheila Boyd for initial evaluation at the [facility] on 04/25/2022. [name] a 76 y.o. female, who is referred here by Orpah Melter, MD for the evaluation of penicillin allergy. [name] accompanied today by her mother who provided/contributed to the history.   Drug reaction: Reaction to *** which occurred *** ago.  Patient was being treated for ***. Symptoms started after taking ***. Symptoms include: *** Symptoms persisted for ***.  Treatment included: ***. Mucosal involvement: {Blank single:19197::"yes ***","no"}.  Denies any *** fevers, chills, changes in medications, foods, personal care products or recent infections. she has tried the following therapies: *** with *** benefit. Systemic steroids ***.  Previous work up includes: ***. Previous history of rash/hives: ***. Previous history of drug reactions: ***.  Referral note: "Patient with hx of unknown reaction to penicillin 50 years ago when she was having surgery for retained placenta and septic, she has failed mx rounds of abx for H pylori and I want to determine if she truly still has allergy"  Assessment and Plan: Sheila Boyd is a 76 y.o. female with: No problem-specific Assessment & Plan notes found for this encounter.  No follow-ups on file.  No orders of the defined types were placed in this encounter.  Lab Orders  No laboratory test(s) ordered today    Other allergy screening: Asthma: {Blank single:19197::"yes","no"} Rhino conjunctivitis: {Blank single:19197::"yes","no"} Food allergy: {Blank single:19197::"yes","no"} Medication allergy: {Blank single:19197::"yes","no"} Hymenoptera  allergy: {Blank single:19197::"yes","no"} Urticaria: {Blank single:19197::"yes","no"} Eczema:{Blank single:19197::"yes","no"} History of recurrent infections suggestive of immunodeficency: {Blank single:19197::"yes","no"}  Diagnostics: Spirometry:  Tracings reviewed. Her effort: {Blank single:19197::"Good reproducible efforts.","It was hard to get consistent efforts and there is a question as to whether this reflects a maximal maneuver.","Poor effort, data can not be interpreted."} FVC: ***L FEV1: ***L, ***% predicted FEV1/FVC ratio: ***% Interpretation: {Blank single:19197::"Spirometry consistent with mild obstructive disease","Spirometry consistent with moderate obstructive disease","Spirometry consistent with severe obstructive disease","Spirometry consistent with possible restrictive disease","Spirometry consistent with mixed obstructive and restrictive disease","Spirometry uninterpretable due to technique","Spirometry consistent with normal pattern","No overt abnormalities noted given today's efforts"}.  Please see scanned spirometry results for details.  Skin Testing: {Blank single:19197::"Select foods","Environmental allergy panel","Environmental allergy panel and select foods","Food allergy panel","None","Deferred due to recent antihistamines use"}. *** Results discussed with patient/family.   Past Medical History: Patient Active Problem List   Diagnosis Date Noted  . Helicobacter pylori (H. pylori) infection 03/09/2022  . Penicillin allergy 03/09/2022  . Aortic valve sclerosis 11/30/2020  . Injury of great toe, right, initial encounter 04/03/2018  . Cataract 06/20/2013   Past Medical History:  Diagnosis Date  . Arthritis   . GERD (gastroesophageal reflux disease)   . Helicobacter pylori (H. pylori) infection 03/09/2022  . Hypertension   . Hypothyroidism   . Penicillin allergy 03/09/2022   Past Surgical History: Past Surgical History:  Procedure Laterality Date  .  ABDOMINAL HYSTERECTOMY    . BACK SURGERY    . BREAST SURGERY     benign  lumps removed  . FOOT SURGERY     right foot  -- pin placed  . POSTERIOR FUSION LUMBAR SPINE  06/11/2012   Medication List:  Current Outpatient Medications  Medication Sig Dispense Refill  .  alendronate (FOSAMAX) 70 MG tablet Take 70 mg by mouth once a week.  3  . amLODipine (NORVASC) 2.5 MG tablet Take 2.5 mg by mouth 2 (two) times daily.    Marland Kitchen ascorbic acid (VITAMIN C) 100 MG tablet Take by mouth. (Patient not taking: Reported on 03/09/2022)    . Cholecalciferol (VITAMIN D) 50 MCG (2000 UT) tablet Take 1 tablet by mouth daily.    . cyanocobalamin 1000 MCG tablet Take 1 tablet by mouth daily.    . Garlic 5009 MG CAPS Take 1 capsule by mouth daily.    Marland Kitchen levothyroxine (SYNTHROID) 25 MCG tablet Take 0.5 tablets by mouth daily.    Marland Kitchen loratadine (CLARITIN) 10 MG tablet Take 1 tablet by mouth as needed.    . vitamin C (ASCORBIC ACID) 250 MG tablet Take 250 mg by mouth daily. gummies (Patient not taking: Reported on 03/09/2022)    . Zinc 50 MG CAPS Take 1 capsule by mouth daily.     No current facility-administered medications for this visit.   Allergies: Allergies  Allergen Reactions  . Penicillins Other (See Comments)    Unknown reaction.   Social History: Social History   Socioeconomic History  . Marital status: Widowed    Spouse name: Not on file  . Number of children: Not on file  . Years of education: Not on file  . Highest education level: Not on file  Occupational History  . Not on file  Tobacco Use  . Smoking status: Never  . Smokeless tobacco: Never  Substance and Sexual Activity  . Alcohol use: No  . Drug use: No  . Sexual activity: Never    Birth control/protection: Surgical  Other Topics Concern  . Not on file  Social History Narrative  . Not on file   Social Determinants of Health   Financial Resource Strain: Not on file  Food Insecurity: Not on file  Transportation Needs: Not on file   Physical Activity: Not on file  Stress: Not on file  Social Connections: Not on file   Lives in a ***. Smoking: *** Occupation: ***  Environmental HistoryFreight forwarder in the house: Estate agent in the family room: {Blank single:19197::"yes","no"} Carpet in the bedroom: {Blank single:19197::"yes","no"} Heating: {Blank single:19197::"electric","gas","heat pump"} Cooling: {Blank single:19197::"central","window","heat pump"} Pet: {Blank single:19197::"yes ***","no"}  Family History: Family History  Problem Relation Age of Onset  . Hypertension Mother    Problem                               Relation Asthma                                   *** Eczema                                *** Food allergy                          *** Allergic rhino conjunctivitis     ***  Review of Systems  Constitutional:  Negative for appetite change, chills, fever and unexpected weight change.  HENT:  Negative for congestion and rhinorrhea.   Eyes:  Negative for itching.  Respiratory:  Negative for cough, chest tightness, shortness of breath and wheezing.   Cardiovascular:  Negative for chest pain.  Gastrointestinal:  Negative for abdominal pain.  Genitourinary:  Negative for difficulty urinating.  Skin:  Negative for rash.  Neurological:  Negative for headaches.   Objective: There were no vitals taken for this visit. There is no height or weight on file to calculate BMI. Physical Exam Vitals and nursing note reviewed.  Constitutional:      Appearance: Normal appearance. [name] well-developed.  HENT:     Head: Normocephalic and atraumatic.     Right Ear: Tympanic membrane and external ear normal.     Left Ear: Tympanic membrane and external ear normal.     Nose: Nose normal.     Mouth/Throat:     Mouth: Mucous membranes are moist.     Pharynx: Oropharynx is clear.  Eyes:     Conjunctiva/sclera: Conjunctivae normal.  Cardiovascular:     Rate and  Rhythm: Normal rate and regular rhythm.     Heart sounds: Normal heart sounds. No murmur heard.    No friction rub. No gallop.  Pulmonary:     Effort: Pulmonary effort is normal.     Breath sounds: Normal breath sounds. No wheezing, rhonchi or rales.  Musculoskeletal:     Cervical back: Neck supple.  Skin:    General: Skin is warm.     Findings: No rash.  Neurological:     Mental Status: [name] alert and oriented to person, place, and time.  Psychiatric:        Behavior: Behavior normal.  The plan was reviewed with the patient/family, and all questions/concerned were addressed.  It was my pleasure to see Sheila Boyd today and participate in her care. Please feel free to contact me with any questions or concerns.  Sincerely,  Rexene Alberts, DO Allergy & Immunology  [facility] office: [place] office: [phone]

## 2022-04-26 ENCOUNTER — Encounter: Payer: Self-pay | Admitting: Allergy

## 2022-04-26 ENCOUNTER — Ambulatory Visit: Payer: Medicare Other | Admitting: Allergy

## 2022-04-26 VITALS — BP 136/80 | HR 81 | Temp 98.1°F | Resp 18 | Ht 65.5 in | Wt 146.5 lb

## 2022-04-26 DIAGNOSIS — T50995D Adverse effect of other drugs, medicaments and biological substances, subsequent encounter: Secondary | ICD-10-CM

## 2022-04-26 DIAGNOSIS — Z880 Allergy status to penicillin: Secondary | ICD-10-CM

## 2022-04-26 NOTE — Assessment & Plan Note (Addendum)
Unknown reaction to penicillin in 1972 while patient underwent hysterectomy for retained placenta. [name] unsure of exact symptoms and timeline of events. [name] also not sure if she ever had penicillin prior to this reaction. Currently has H. Pylori and failed other antibiotic treatment for this.  Negative skin testing to penicillin and its components today.  Schedule for drug challenge appointment.  I'm going to reach out to Dr. Tommy Medal to see what exact medication they wanted you to be on.   You must be off antihistamines for 3-5 days before. Must be in good health and not ill. No vaccines/injections/antibiotics within the past 7 days. Plan on being in the office for 2-3 hours and must bring in the drug you want to do the oral challenge for - will send in prescription to pick up a few days before.

## 2022-04-26 NOTE — Patient Instructions (Addendum)
Negative skin testing to penicillin and its components today. Schedule for drug challenge appointment.  I'm going to reach out your Dr. Tommy Medal to see what exact medication they wanted you to be on.   You must be off antihistamines for 3-5 days before. Must be in good health and not ill. No vaccines/injections/antibiotics within the past 7 days. Plan on being in the office for 2-3 hours and must bring in the drug you want to do the oral challenge for - will send in prescription to pick up a few days before.   Follow up for drug challenge.

## 2022-04-27 ENCOUNTER — Telehealth: Payer: Self-pay | Admitting: Allergy

## 2022-04-27 MED ORDER — AMOXICILLIN 500 MG PO TABS: ORAL_TABLET | ORAL | 0 refills | Status: DC

## 2022-04-27 NOTE — Telephone Encounter (Signed)
Please call patient.  Ask if she can come to drug challenge to [facility] at 8:30AM on 8/17 or 8/22 as she has ID appointment on 8/23.  ID gave me the amoxicillin dose and I will send in prescription to the pharmacy - check which pharmacy to send this to.   Thank you.

## 2022-04-27 NOTE — Addendum Note (Signed)
Addended by: Garnet Sierras on: 04/27/2022 02:52 PM   Modules accepted: Orders

## 2022-04-27 NOTE — Telephone Encounter (Signed)
Spoke with patient, she stated that she could come in next Tuesday 05/03/2022 at 8:30 am. I have rescheduled her for then. Patient would like the medication sent to [facility] [employer] [place].

## 2022-05-02 NOTE — Progress Notes (Unsigned)
Follow Up Note  RE: Sheila Boyd MRN: [id number] DOB: 11-15-1945 Date of Office Visit: 05/03/2022  Referring provider: Orpah Melter, MD Primary care provider: Orpah Melter, MD  Chief Complaint: No chief complaint on file.  Assessment and Plan: Sheila Boyd is a 76 y.o. female with: No problem-specific Assessment & Plan notes found for this encounter.  No follow-ups on file.  Plan: Challenge drug: *** Challenge as per protocol: {Blank single:19197::"Passed","Failed"} Total time: ***  For next 24 hours monitor for hives, swelling, shortness of breath and dizziness. If you see these symptoms, use Benadryl for mild symptoms and for more severe symptoms go to ER/urgent care or call 911.  If no adverse symptoms in the next 24 hours then will take off drug from allergy list.  History of Present Illness: I had the pleasure of seeing Sheila Boyd for a follow up visit at the [facility] on 05/02/2022. [name] a 76 y.o. female, who is being followed for adverse drug reaction. Her previous allergy office visit was on 04/26/2022 with Dr. Maudie Mercury. Today she is here for amoxicillin drug challenge.   History of Reaction: Penicillin allergy Unknown reaction to penicillin in 1972 while patient underwent hysterectomy for retained placenta. [name] unsure of exact symptoms and timeline of events. [name] also not sure if she ever had penicillin prior to this reaction. Currently has H. Pylori and failed other antibiotic treatment for this. Negative skin testing to penicillin and its components today. Schedule for drug challenge appointment. I'm going to reach out to Dr. Tommy Medal to see what exact medication they wanted you to be on.  You must be off antihistamines for 3-5 days before. Must be in good health and not ill. No vaccines/injections/antibiotics within the past 7 days. Plan on being in the office for 2-3 hours and must bring in the drug you want to do the oral challenge  for - will send in prescription to pick up a few days before.   Interval History: Patient has not been ill, she has not had any accidental exposures to the culprit medication.   Recent/Current History: Pulmonary disease: {Blank single:19197::"yes","no"} Cardiac disease: {Blank single:19197::"yes","no"} Respiratory infection: {Blank single:19197::"yes","no"} Rash: {Blank single:19197::"yes","no"} Itch: {Blank single:19197::"yes","no"} Swelling: {Blank single:19197::"yes","no"} Cough: {Blank single:19197::"yes","no"} Shortness of breath: {Blank single:19197::"yes","no"} Runny/stuffy nose: {Blank single:19197::"yes","no"} Itchy eyes: {Blank single:19197::"yes","no"} Beta-blocker use: {Blank single:19197::"yes","no"}  Patient/guardian was informed of the test procedure with verbalized understanding of the risk of anaphylaxis. Consent was signed.   Last antihistamine use: *** Last beta-blocker use: ***  Medication List:  Current Outpatient Medications  Medication Sig Dispense Refill   alendronate (FOSAMAX) 70 MG tablet Take 70 mg by mouth once a week.  3   amLODipine (NORVASC) 2.5 MG tablet Take 2.5 mg by mouth 2 (two) times daily.     amoxicillin (AMOXIL) 500 MG tablet DO NOT TAKE AT HOME. BRING TO DR Terrilee Dudzik'S OFFICE FOR CHALLENGE. 5 tablet 0   ascorbic acid (VITAMIN C) 100 MG tablet Take by mouth. (Patient not taking: Reported on 03/09/2022)     Cholecalciferol (VITAMIN D) 50 MCG (2000 UT) tablet Take 1 tablet by mouth daily. (Patient not taking: Reported on 04/26/2022)     Cholecalciferol (VITAMIN D3 PO) Take by mouth.     cyanocobalamin 1000 MCG tablet Take 1 tablet by mouth daily.     Garlic 3382 MG CAPS Take 1 capsule by mouth daily.     levothyroxine (SYNTHROID) 25 MCG tablet Take 0.5 tablets by  mouth daily.     loratadine (CLARITIN) 10 MG tablet Take 1 tablet by mouth as needed. (Patient not taking: Reported on 04/26/2022)     pantoprazole (PROTONIX) 20 MG tablet Take 20 mg by mouth  daily.     vitamin C (ASCORBIC ACID) 250 MG tablet Take 250 mg by mouth daily. gummies     Zinc 50 MG CAPS Take 1 capsule by mouth daily.     No current facility-administered medications for this visit.   Allergies: Allergies  Allergen Reactions   Penicillins Other (See Comments)    Unknown reaction.   I reviewed her past medical history, social history, family history, and environmental history and no significant changes have been reported from her previous visit.  Review of Systems  Constitutional:  Negative for appetite change, chills, fever and unexpected weight change.  HENT:  Negative for congestion and rhinorrhea.   Eyes:  Negative for itching.  Respiratory:  Negative for cough, chest tightness, shortness of breath and wheezing.   Cardiovascular:  Negative for chest pain.  Gastrointestinal:  Negative for abdominal pain.  Genitourinary:  Negative for difficulty urinating.  Skin:  Negative for rash.  Neurological:  Negative for headaches.   Objective: There were no vitals taken for this visit. There is no height or weight on file to calculate BMI. Physical Exam Vitals and nursing note reviewed.  Constitutional:      Appearance: Normal appearance. [name] well-developed.  HENT:     Head: Normocephalic and atraumatic.     Right Ear: Tympanic membrane and external ear normal.     Left Ear: Tympanic membrane and external ear normal.     Nose: Nose normal.     Mouth/Throat:     Mouth: Mucous membranes are moist.     Pharynx: Oropharynx is clear.  Eyes:     Conjunctiva/sclera: Conjunctivae normal.  Cardiovascular:     Rate and Rhythm: Normal rate and regular rhythm.     Heart sounds: Normal heart sounds. No murmur heard.    No friction rub. No gallop.  Pulmonary:     Effort: Pulmonary effort is normal.     Breath sounds: Normal breath sounds. No wheezing, rhonchi or rales.  Musculoskeletal:     Cervical back: Neck supple.  Skin:    General: Skin is warm.      Findings: No rash.  Neurological:     Mental Status: [name] alert and oriented to person, place, and time.  Psychiatric:        Behavior: Behavior normal.     Diagnostics: Spirometry:  Tracings reviewed. Her effort: {Blank single:19197::"Good reproducible efforts.","It was hard to get consistent efforts and there is a question as to whether this reflects a maximal maneuver.","Poor effort, data can not be interpreted."} FVC: ***L FEV1: ***L, ***% predicted FEV1/FVC ratio: ***% Interpretation: {Blank single:19197::"Spirometry consistent with mild obstructive disease","Spirometry consistent with moderate obstructive disease","Spirometry consistent with severe obstructive disease","Spirometry consistent with possible restrictive disease","Spirometry consistent with mixed obstructive and restrictive disease","Spirometry uninterpretable due to technique","Spirometry consistent with normal pattern","No overt abnormalities noted given today's efforts"}.  Please see scanned spirometry results for details.  Skin Testing: {Blank single:19197::"None","Deferred due to recent antihistamines use"}. Positive test to: ***. Negative test to: ***.  Results discussed with patient/family.   Previous notes and tests were reviewed. The plan was reviewed with the patient/family, and all questions/concerned were addressed.  It was my pleasure to see Tristan today and participate in her care. Please feel free to contact me  with any questions or concerns.  Sincerely,  Rexene Alberts, DO Allergy & Immunology  [facility] office: [place] office: [phone]

## 2022-05-03 ENCOUNTER — Ambulatory Visit (INDEPENDENT_AMBULATORY_CARE_PROVIDER_SITE_OTHER): Payer: Medicare Other | Admitting: Allergy

## 2022-05-03 ENCOUNTER — Encounter: Payer: Self-pay | Admitting: Allergy

## 2022-05-03 VITALS — BP 120/70 | HR 70 | Temp 98.0°F | Resp 20

## 2022-05-03 DIAGNOSIS — Z880 Allergy status to penicillin: Secondary | ICD-10-CM

## 2022-05-03 DIAGNOSIS — T50995D Adverse effect of other drugs, medicaments and biological substances, subsequent encounter: Secondary | ICD-10-CM

## 2022-05-03 NOTE — Assessment & Plan Note (Signed)
Past history - Unknown reaction to penicillin in 1972 while patient underwent hysterectomy for retained placenta. [name] unsure of exact symptoms and timeline of events. [name] also not sure if she ever had penicillin prior to this reaction. Currently has H. Pylori and failed other antibiotic treatment for this. 2023 skin testing negative to penicillin and its components. Interim history  Patient tolerated a total amoxicillin of '1005mg'$  in 4 doses with no reactions.   For next 24 hours monitor for hives, swelling, shortness of breath and dizziness. If you see these symptoms, use Benadryl for mild symptoms and for more severe symptoms go to ER/urgent care or call 911.  Follow up with ID as scheduled tomorrow.  Okay to take amoxicillin/penicillin type of antibiotics.  Will remove penicillin allergy from drug allergy list.  Discussed that her risk of re-developing penicillin allergy is the same as the general populations and should not avoid it if she needs to be treated with penicillin in the future.

## 2022-05-03 NOTE — Patient Instructions (Addendum)
You tolerated a total of '1005mg'$  of amoxicillin with no issues.  For next 24 hours monitor for hives, swelling, shortness of breath and dizziness. If you see these symptoms, use Benadryl for mild symptoms and for more severe symptoms go to ER/urgent care or call 911.  Follow up with your ID doctor as scheduled tomorrow.  Okay to take amoxicillin/penicillin type of antibiotics.  Will remove penicillin allergy from drug allergy list. Discussed that her risk of re-developing penicillin allergy is the same as the general populations and should not avoid it if she needs to be treated with penicillin in the future.   Follow up with me as needed.

## 2022-05-04 ENCOUNTER — Other Ambulatory Visit (HOSPITAL_COMMUNITY): Payer: Self-pay

## 2022-05-04 ENCOUNTER — Other Ambulatory Visit: Payer: Self-pay

## 2022-05-04 ENCOUNTER — Encounter: Payer: Self-pay | Admitting: Infectious Disease

## 2022-05-04 ENCOUNTER — Ambulatory Visit (INDEPENDENT_AMBULATORY_CARE_PROVIDER_SITE_OTHER): Payer: Medicare Other | Admitting: Infectious Disease

## 2022-05-04 VITALS — BP 148/80 | HR 94 | Temp 97.8°F | Ht 66.0 in | Wt 144.0 lb

## 2022-05-04 DIAGNOSIS — A048 Other specified bacterial intestinal infections: Secondary | ICD-10-CM

## 2022-05-04 DIAGNOSIS — Z880 Allergy status to penicillin: Secondary | ICD-10-CM | POA: Diagnosis not present

## 2022-05-04 MED ORDER — RIFABUTIN 150 MG PO CAPS
300.0000 mg | ORAL_CAPSULE | Freq: Every day | ORAL | 0 refills | Status: DC
  Filled 2022-05-04: qty 28, 14d supply, fill #0
  Filled 2022-05-04: qty 6, 3d supply, fill #0
  Filled 2022-05-04: qty 22, 11d supply, fill #0

## 2022-05-04 MED ORDER — AMOXICILLIN 500 MG PO CAPS
500.0000 mg | ORAL_CAPSULE | Freq: Three times a day (TID) | ORAL | 0 refills | Status: DC
  Filled 2022-05-04: qty 42, 14d supply, fill #0

## 2022-05-04 MED ORDER — AMOXICILLIN 250 MG PO CAPS
250.0000 mg | ORAL_CAPSULE | Freq: Three times a day (TID) | ORAL | 0 refills | Status: DC
  Filled 2022-05-04: qty 9, 3d supply, fill #0
  Filled 2022-05-04: qty 33, 11d supply, fill #0

## 2022-05-04 MED ORDER — AMOXICILLIN 500 MG PO CAPS
ORAL_CAPSULE | ORAL | 0 refills | Status: DC
  Filled 2022-05-04 (×2): qty 42, 14d supply, fill #0

## 2022-05-04 MED ORDER — PANTOPRAZOLE SODIUM 40 MG PO TBEC
40.0000 mg | DELAYED_RELEASE_TABLET | Freq: Two times a day (BID) | ORAL | 0 refills | Status: DC
  Filled 2022-05-04: qty 28, 14d supply, fill #0

## 2022-05-04 NOTE — Progress Notes (Signed)
Subjective:  Chief complaint still with some abdominal discomfort   Patient ID: Sheila Boyd, female    DOB: Mar 31, 1946, 76 y.o.   MRN: [id number]  HPI  Sheila Boyd is a very lovely [place] born lady who has a past medical history significant for hypothyroidism hypertension and seasonal allergies, along with a remote penicillin allergy who was diagnosed with Helicobacter pylori via EGD in January 2023.  This was done with [facility].  Note I did not have records from [facility] beyond the endoscopy reports including upper and lower endoscopy but we were able to endeavor through talking to [facility] and through review of dispensing history to see that she was given a regimen of clarithromycin doxycycline and bismuth on October 06, 2021 and then retreated with levofloxacin metronidazole and Protonix in March 2023.  I doidnot have records of the repeat testing that proved that she still had this but since she was referred to [place] to assume that this was proven and she says that it was Found on stool antigen testing.  In questioning her with regards to her penicillin allergy she said that she had septicemia due to retained placenta after the birth of one of her children.  She was taken to the operating room and upon return from the OR and eventual extubation she was told by the surgeon that she was allergic to penicillin and was not to take it ever again.  She herself does not know what the nature of the reaction was.  She does not recall having a rash.  Terms of symptomatology seems seems to have some abdominal discomfort that is ongoing and likely related to the H. pylori infection.  She has had trouble tolerating antibiotics for most of her life.  Referred her to allergy and immunology and she was gracious seen by Dr. Rexene Alberts formed penicillin skin testing which was negative and an amoxicillin challenge which was tolerated without any difficulties.  Patient still has some abdominal  discomfort.    Past Medical History:  Diagnosis Date   Arthritis    GERD (gastroesophageal reflux disease)    Helicobacter pylori (H. pylori) infection 03/09/2022   Hypertension    Hypothyroidism    Penicillin allergy 03/09/2022    Past Surgical History:  Procedure Laterality Date   ABDOMINAL HYSTERECTOMY     BACK SURGERY     BREAST SURGERY     benign  lumps removed   FOOT SURGERY     right foot  -- pin placed   POSTERIOR FUSION LUMBAR SPINE  06/11/2012    Family History  Problem Relation Age of Onset   Hypertension Mother       Social History   Socioeconomic History   Marital status: Widowed    Spouse name: Not on file   Number of children: Not on file   Years of education: Not on file   Highest education level: Not on file  Occupational History   Not on file  Tobacco Use   Smoking status: Never   Smokeless tobacco: Never  Substance and Sexual Activity   Alcohol use: No   Drug use: No   Sexual activity: Never    Birth control/protection: Surgical  Other Topics Concern   Not on file  Social History Narrative   Not on file   Social Determinants of Health   Financial Resource Strain: Not on file  Food Insecurity: Not on file  Transportation Needs: Not on file  Physical Activity: Not on file  Stress: Not  on file  Social Connections: Not on file    No Known Allergies    Current Outpatient Medications:    alendronate (FOSAMAX) 70 MG tablet, Take 70 mg by mouth once a week., Disp: , Rfl: 3   amLODipine (NORVASC) 2.5 MG tablet, Take 2.5 mg by mouth 2 (two) times daily., Disp: , Rfl:    Cholecalciferol (VITAMIN D3 PO), Take by mouth., Disp: , Rfl:    cyanocobalamin 1000 MCG tablet, Take 1 tablet by mouth daily., Disp: , Rfl:    Garlic 3570 MG CAPS, Take 1 capsule by mouth daily., Disp: , Rfl:    levothyroxine (SYNTHROID) 25 MCG tablet, Take 0.5 tablets by mouth daily., Disp: , Rfl:    loratadine (CLARITIN) 10 MG tablet, Take 1 tablet by mouth as needed.  (Patient not taking: Reported on 04/26/2022), Disp: , Rfl:    pantoprazole (PROTONIX) 20 MG tablet, Take 20 mg by mouth daily., Disp: , Rfl:    vitamin C (ASCORBIC ACID) 250 MG tablet, Take 250 mg by mouth daily. gummies, Disp: , Rfl:    Zinc 50 MG CAPS, Take 1 capsule by mouth daily., Disp: , Rfl:    Review of Systems  Constitutional:  Negative for activity change, appetite change, chills, diaphoresis, fatigue, fever and unexpected weight change.  HENT:  Negative for congestion, rhinorrhea, sinus pressure, sneezing, sore throat and trouble swallowing.   Eyes:  Negative for photophobia and visual disturbance.  Respiratory:  Negative for cough, chest tightness, shortness of breath, wheezing and stridor.   Cardiovascular:  Negative for chest pain, palpitations and leg swelling.  Gastrointestinal:  Positive for abdominal pain. Negative for abdominal distention, anal bleeding, blood in stool, constipation, diarrhea, nausea and vomiting.  Genitourinary:  Negative for difficulty urinating, dysuria, flank pain and hematuria.  Musculoskeletal:  Negative for arthralgias, back pain, gait problem, joint swelling and myalgias.  Skin:  Negative for color change, pallor, rash and wound.  Neurological:  Negative for dizziness, tremors, weakness and light-headedness.  Hematological:  Negative for adenopathy. Does not bruise/bleed easily.  Psychiatric/Behavioral:  Negative for agitation, behavioral problems, confusion, decreased concentration, dysphoric mood and sleep disturbance.        Objective:   Physical Exam Constitutional:      General: [name] not in acute distress.    Appearance: Normal appearance. [name] well-developed. [name] not ill-appearing or diaphoretic.  HENT:     Head: Normocephalic and atraumatic.     Right Ear: Hearing and external ear normal.     Left Ear: Hearing and external ear normal.     Nose: No nasal deformity or rhinorrhea.  Eyes:     General: No scleral icterus.     Conjunctiva/sclera: Conjunctivae normal.     Right eye: Right conjunctiva is not injected.     Left eye: Left conjunctiva is not injected.     Pupils: Pupils are equal, round, and reactive to light.  Neck:     Vascular: No JVD.  Cardiovascular:     Rate and Rhythm: Normal rate and regular rhythm.     Heart sounds: S1 normal and S2 normal.  Pulmonary:     Effort: Pulmonary effort is normal. No respiratory distress.     Breath sounds: No wheezing.  Abdominal:     General: There is no distension.  Musculoskeletal:        General: Normal range of motion.     Right shoulder: Normal.     Left shoulder: Normal.  Cervical back: Normal range of motion and neck supple.     Right hip: Normal.     Left hip: Normal.     Right knee: Normal.     Left knee: Normal.  Lymphadenopathy:     Head:     Right side of head: No submandibular, preauricular or posterior auricular adenopathy.     Left side of head: No submandibular, preauricular or posterior auricular adenopathy.     Cervical: No cervical adenopathy.     Right cervical: No superficial or deep cervical adenopathy.    Left cervical: No superficial or deep cervical adenopathy.  Skin:    General: Skin is warm and dry.     Coloration: Skin is not pale.     Findings: No abrasion, bruising, ecchymosis, erythema, lesion or rash.     Nails: There is no clubbing.  Neurological:     General: No focal deficit present.     Mental Status: [name] alert and oriented to person, place, and time.     Sensory: No sensory deficit.     Coordination: Coordination normal.     Gait: Gait normal.  Psychiatric:        Attention and Perception: [name] attentive.        Mood and Affect: Mood normal.        Speech: Speech normal.        Behavior: Behavior normal. Behavior is cooperative.        Thought Content: Thought content normal.        Judgment: Judgment normal.           Assessment & Plan:  Refractory Helicobacter pylori infection:  I am  sending prescriptions to [facility] for amoxicillin 750 mg 3 times daily rifabutin 300 mg daily and pantoprazole 40 mg twice daily.  I reviewed with her how to take the medications we will have [facility] prescription medications to her and she will also meet with her pharmacist to make sure she knows how to take them correctly.  I will plan on testing her stool with an H. pylori antigen after she completes therapy and after she has been off PPI times a month.  Penicillin allergy: She clearly does not have an allergy this point in time in the allergy been removed from her list.  I spent 31 minutes with the patient including than 50% of the time in face to face counseling of the patient guarding her H. pylori her prior alleged penicillin allergy, long with review of medical records in preparation for the visit and during the visit and in coordination of her care.

## 2022-05-05 ENCOUNTER — Encounter: Payer: Medicare Other | Admitting: Allergy

## 2022-05-06 ENCOUNTER — Other Ambulatory Visit (HOSPITAL_COMMUNITY): Payer: Self-pay

## 2022-05-07 ENCOUNTER — Other Ambulatory Visit (HOSPITAL_COMMUNITY): Payer: Self-pay

## 2022-05-09 ENCOUNTER — Other Ambulatory Visit (HOSPITAL_COMMUNITY): Payer: Self-pay

## 2022-05-10 ENCOUNTER — Other Ambulatory Visit (HOSPITAL_COMMUNITY): Payer: Self-pay

## 2022-05-27 ENCOUNTER — Other Ambulatory Visit: Payer: Self-pay

## 2022-05-27 ENCOUNTER — Encounter (HOSPITAL_BASED_OUTPATIENT_CLINIC_OR_DEPARTMENT_OTHER): Payer: Self-pay

## 2022-05-27 ENCOUNTER — Emergency Department (HOSPITAL_BASED_OUTPATIENT_CLINIC_OR_DEPARTMENT_OTHER)
Admission: EM | Admit: 2022-05-27 | Discharge: 2022-05-27 | Disposition: A | Discharge status: Home / Self Care | Payer: Medicare Other | Attending: Emergency Medicine | Admitting: Emergency Medicine

## 2022-05-27 ENCOUNTER — Emergency Department (HOSPITAL_BASED_OUTPATIENT_CLINIC_OR_DEPARTMENT_OTHER): Payer: Medicare Other | Admitting: Radiology

## 2022-05-27 DIAGNOSIS — R5383 Other fatigue: Secondary | ICD-10-CM

## 2022-05-27 DIAGNOSIS — I10 Essential (primary) hypertension: Secondary | ICD-10-CM | POA: Diagnosis not present

## 2022-05-27 DIAGNOSIS — E039 Hypothyroidism, unspecified: Secondary | ICD-10-CM | POA: Insufficient documentation

## 2022-05-27 DIAGNOSIS — R059 Cough, unspecified: Secondary | ICD-10-CM | POA: Diagnosis not present

## 2022-05-27 DIAGNOSIS — R051 Acute cough: Secondary | ICD-10-CM | POA: Diagnosis not present

## 2022-05-27 DIAGNOSIS — Z20822 Contact with and (suspected) exposure to covid-19: Secondary | ICD-10-CM | POA: Diagnosis not present

## 2022-05-27 DIAGNOSIS — R531 Weakness: Secondary | ICD-10-CM | POA: Diagnosis not present

## 2022-05-27 DIAGNOSIS — N390 Urinary tract infection, site not specified: Secondary | ICD-10-CM | POA: Diagnosis not present

## 2022-05-27 LAB — CBC WITH DIFFERENTIAL/PLATELET
Abs Immature Granulocytes: 0.01 10*3/uL (ref 0.00–0.07)
Basophils Absolute: 0 10*3/uL (ref 0.0–0.1)
Basophils Relative: 1 %
Eosinophils Absolute: 0 10*3/uL (ref 0.0–0.5)
Eosinophils Relative: 1 %
HCT: 40.9 % (ref 36.0–46.0)
Hemoglobin: 13.3 g/dL (ref 12.0–15.0)
Immature Granulocytes: 0 %
Lymphocytes Relative: 21 %
Lymphs Abs: 1.3 10*3/uL (ref 0.7–4.0)
MCH: 28.2 pg (ref 26.0–34.0)
MCHC: 32.5 g/dL (ref 30.0–36.0)
MCV: 86.8 fL (ref 80.0–100.0)
Monocytes Absolute: 0.7 10*3/uL (ref 0.1–1.0)
Monocytes Relative: 11 %
Neutro Abs: 4.3 10*3/uL (ref 1.7–7.7)
Neutrophils Relative %: 66 %
Platelets: 243 10*3/uL (ref 150–400)
RBC: 4.71 MIL/uL (ref 3.87–5.11)
RDW: 12.9 % (ref 11.5–15.5)
WBC: 6.3 10*3/uL (ref 4.0–10.5)
nRBC: 0 % (ref 0.0–0.2)

## 2022-05-27 LAB — URINALYSIS, ROUTINE W REFLEX MICROSCOPIC
Bilirubin Urine: NEGATIVE
Bilirubin Urine: NEGATIVE
Glucose, UA: NEGATIVE mg/dL
Glucose, UA: NEGATIVE mg/dL
Hgb urine dipstick: NEGATIVE
Hgb urine dipstick: NEGATIVE
Ketones, ur: NEGATIVE mg/dL
Ketones, ur: NEGATIVE mg/dL
Nitrite: NEGATIVE
Nitrite: NEGATIVE
Protein, ur: NEGATIVE mg/dL
Protein, ur: NEGATIVE mg/dL
Specific Gravity, Urine: 1.016 (ref 1.005–1.030)
Specific Gravity, Urine: 1.016 (ref 1.005–1.030)
pH: 6 (ref 5.0–8.0)
pH: 6 (ref 5.0–8.0)

## 2022-05-27 LAB — COMPREHENSIVE METABOLIC PANEL
ALT: 9 U/L (ref 0–44)
AST: 18 U/L (ref 15–41)
Albumin: 4.8 g/dL (ref 3.5–5.0)
Alkaline Phosphatase: 56 U/L (ref 38–126)
Anion gap: 10 (ref 5–15)
BUN: 10 mg/dL (ref 8–23)
CO2: 27 mmol/L (ref 22–32)
Calcium: 10.8 mg/dL — ABNORMAL HIGH (ref 8.9–10.3)
Chloride: 103 mmol/L (ref 98–111)
Creatinine, Ser: 1.1 mg/dL — ABNORMAL HIGH (ref 0.44–1.00)
GFR, Estimated: 52 mL/min — ABNORMAL LOW (ref >60)
Glucose, Bld: 99 mg/dL (ref 70–99)
Potassium: 4.1 mmol/L (ref 3.5–5.1)
Sodium: 140 mmol/L (ref 135–145)
Total Bilirubin: 0.4 mg/dL (ref 0.3–1.2)
Total Protein: 8.1 g/dL (ref 6.5–8.1)

## 2022-05-27 LAB — RESP PANEL BY RT-PCR (FLU A&B, COVID) ARPGX2
Influenza A by PCR: NEGATIVE
Influenza B by PCR: NEGATIVE
SARS Coronavirus 2 by RT PCR: NEGATIVE

## 2022-05-27 LAB — TROPONIN I (HIGH SENSITIVITY): Troponin I (High Sensitivity): 6 ng/L (ref <18)

## 2022-05-27 LAB — TSH: TSH: 0.644 u[IU]/mL (ref 0.350–4.500)

## 2022-05-27 LAB — BRAIN NATRIURETIC PEPTIDE: B Natriuretic Peptide: 59 pg/mL (ref 0.0–100.0)

## 2022-05-27 LAB — T4, FREE: Free T4: 0.92 ng/dL (ref 0.61–1.12)

## 2022-05-27 MED ORDER — CEPHALEXIN 500 MG PO CAPS: 500.0000 mg | ORAL_CAPSULE | Freq: Four times a day (QID) | ORAL | 0 refills | Status: DC

## 2022-05-27 MED ORDER — CEPHALEXIN 500 MG PO CAPS: 500.0000 mg | ORAL_CAPSULE | Freq: Three times a day (TID) | ORAL | 0 refills | Status: AC

## 2022-05-27 NOTE — ED Triage Notes (Signed)
Pt presents POV from home. Pt sent here from [facility] for generalized weakness since Sunday and wanted a cxr d/t possible fluid on her lungs.   Pt has no complaints other than "feeling tired" x6 days

## 2022-05-27 NOTE — ED Notes (Signed)
Pt only complaint of feeling tired for the past 6 days.

## 2022-05-27 NOTE — ED Provider Notes (Signed)
[place] EMERGENCY DEPT Provider Note   CSN: [id number] Arrival date & time: 05/27/22  1454     History {Add pertinent medical, surgical, social history, OB history to HPI:1} Chief Complaint  Patient presents with   Fatigue    Sheila Boyd is a 76 y.o. female.  HPI    76 year old female with a history of hypertension, hypothyroidism, H. pylori infection, presents with concern for fatigue.  Started on Sunday morning, just didn't like how she felt, then has been ongoing since then Just feels sick, difficult to explain Started coughing, mucus coming up Monday felt a little better but Monday evening feeling sick again, feeling up and down each day Today took daughter to school then on way back stopped at the doctor and the doctor sent her here to have evaluation and XR No chest pain, no shortness of breath No sore throat, no nausea, vomiting, diarrhea, black or bloody stools, abdominal pain, constipation, no urinary symptoms, no palpitations. Dry mouth feelings. No sick contacts. No lightheadedness or syncope  No recent change in medications, finished abx a few weeks ago    Past Medical History:  Diagnosis Date   Arthritis    GERD (gastroesophageal reflux disease)    Helicobacter pylori (H. pylori) infection 03/09/2022   Hypertension    Hypothyroidism    Penicillin allergy 03/09/2022     Home Medications Prior to Admission medications   Medication Sig Start Date End Date Taking? Authorizing Provider  alendronate (FOSAMAX) 70 MG tablet Take 70 mg by mouth once a week. 02/21/18   [provider]  amLODipine (NORVASC) 2.5 MG tablet Take 2.5 mg by mouth 2 (two) times daily. 10/13/20   [provider]  amoxicillin (AMOXIL) 250 MG capsule Take 1 capsule (250 mg total) by mouth 3 (three) times daily. Combine with '500mg'$  for '750mg'$   three times daily with pantoprazole and rifabutin meds 05/04/22   Tommy Medal, Lavell Islam, MD  amoxicillin (AMOXIL)  500 MG capsule Take 1 capsule by mouth three times daily with '250mg'$  cap for total dose of '750mg'$  three times daily. 05/04/22   Truman Hayward, MD  Cholecalciferol (VITAMIN D3 PO) Take by mouth.    [provider]  cyanocobalamin 1000 MCG tablet Take 1 tablet by mouth daily.    [provider]  Garlic 7829 MG CAPS Take 1 capsule by mouth daily.    [provider]  levothyroxine (SYNTHROID) 25 MCG tablet Take 0.5 tablets by mouth daily.    [provider]  loratadine (CLARITIN) 10 MG tablet Take 1 tablet by mouth as needed. Patient not taking: Reported on 04/26/2022    [provider]  pantoprazole (PROTONIX) 40 MG tablet Take 1 tablet (40 mg total) by mouth 2 (two) times daily. Take with amoxicillin and rifabutin 05/04/22   Tommy Medal, Lavell Islam, MD  rifabutin [facility]) 150 MG capsule Take 2 capsules (300 mg total) by mouth daily. Combine this with '750mg'$  amoxicillin three times daily and pantoprazole twice daily. 05/04/22   Truman Hayward, MD  vitamin C (ASCORBIC ACID) 250 MG tablet Take 250 mg by mouth daily. gummies    [provider]  Zinc 50 MG CAPS Take 1 capsule by mouth daily.    [provider]      Allergies    Patient has no known allergies.    Review of Systems   Review of Systems  Physical Exam Updated Vital Signs BP (!) 150/78 (BP Location: Right Arm)  Pulse 76   Temp 97.8 F (36.6 C)   Resp 18   SpO2 100%  Physical Exam  ED Results / Procedures / Treatments   Labs (all labs ordered are listed, but only abnormal results are displayed) Labs Reviewed  RESP PANEL BY RT-PCR (FLU A&B, COVID) ARPGX2  CBC WITH DIFFERENTIAL/PLATELET  COMPREHENSIVE METABOLIC PANEL    EKG None  Radiology No results found.  Procedures Procedures  {Document cardiac monitor, telemetry assessment procedure when appropriate:1}  Medications Ordered in ED Medications - No data to display  ED Course/ Medical Decision  Making/ A&P                           Medical Decision Making Amount and/or Complexity of Data Reviewed Labs: ordered. Radiology: ordered.   ***  {Document critical care time when appropriate:1} {Document review of labs and clinical decision tools ie heart score, Chads2Vasc2 etc:1}  {Document your independent review of radiology images, and any outside records:1} {Document your discussion with family members, caretakers, and with consultants:1} {Document social determinants of health affecting pt's care:1} {Document your decision making why or why not admission, treatments were needed:1} Final Clinical Impression(s) / ED Diagnoses Final diagnoses:  None    Rx / DC Orders ED Discharge Orders     None

## 2022-06-06 DIAGNOSIS — N390 Urinary tract infection, site not specified: Secondary | ICD-10-CM | POA: Diagnosis not present

## 2022-06-21 ENCOUNTER — Ambulatory Visit: Payer: Medicare Other | Admitting: Infectious Disease

## 2022-06-21 ENCOUNTER — Other Ambulatory Visit: Payer: Self-pay

## 2022-06-21 ENCOUNTER — Encounter: Payer: Self-pay | Admitting: Infectious Disease

## 2022-06-21 VITALS — BP 161/76 | HR 76 | Temp 97.9°F | Wt 144.0 lb

## 2022-06-21 DIAGNOSIS — Z880 Allergy status to penicillin: Secondary | ICD-10-CM

## 2022-06-21 DIAGNOSIS — A048 Other specified bacterial intestinal infections: Secondary | ICD-10-CM | POA: Diagnosis not present

## 2022-06-21 DIAGNOSIS — Z7185 Encounter for immunization safety counseling: Secondary | ICD-10-CM

## 2022-06-21 DIAGNOSIS — N8110 Cystocele, unspecified: Secondary | ICD-10-CM | POA: Diagnosis not present

## 2022-06-21 DIAGNOSIS — N390 Urinary tract infection, site not specified: Secondary | ICD-10-CM | POA: Diagnosis not present

## 2022-06-21 DIAGNOSIS — Z4689 Encounter for fitting and adjustment of other specified devices: Secondary | ICD-10-CM | POA: Diagnosis not present

## 2022-06-21 NOTE — Progress Notes (Signed)
Subjective:  Chief complaint follow-up on H. pylori infection  Patient ID: Sheila Boyd, female    DOB: 12/14/1945, 76 y.o.   MRN: [id number]  HPI  Sheila Boyd is a very lovely [place] born lady who has a past medical history significant for hypothyroidism hypertension and seasonal allergies, along with a remote penicillin allergy who was diagnosed with Helicobacter pylori via EGD in January 2023.  This was done with [facility].  Note I did not have records from [place] beyond the endoscopy reports including upper and lower endoscopy but we were able to endeavor through talking to [facility] and through review of dispensing history to see that she was given a regimen of clarithromycin doxycycline and bismuth on October 06, 2021 and then retreated with levofloxacin metronidazole and Protonix in March 2023.  I doidnot have records of the repeat testing that proved that she still had this but since she was referred to [place] to assume that this was proven and she says that it was Found on stool antigen testing.  In questioning her with regards to her penicillin allergy she said that she had septicemia due to retained placenta after the birth of one of her children.  She was taken to the operating room and upon return from the OR and eventual extubation she was told by the surgeon that she was allergic to penicillin and was not to take it ever again.  She herself does not know what the nature of the reaction was.  She does not recall having a rash.  Terms of symptomatology seems seems to have some abdominal discomfort that is ongoing and likely related to the H. pylori infection.  She has had trouble tolerating antibiotics for most of her life.  Referred her to allergy and immunology and she was gracious seen by Dr. Rexene Alberts formed penicillin skin testing which was negative and an amoxicillin challenge which was tolerated without any difficulties.  Patient was still having some abdominal discomfort when I  saw her last.   I sent rx for amoxicillin 750 mg 3 times daily rifabutin 300 mg daily and pantoprazole 40 mg twice daily to [facility].   Pick up her medications and completed 2 weeks of treatment.   She did have to take a different antibiotic for recent urinary tract infection but has not been on PPI.  [name] dramatically better in terms of her symptomatology.    Past Medical History:  Diagnosis Date   Arthritis    GERD (gastroesophageal reflux disease)    Helicobacter pylori (H. pylori) infection 03/09/2022   Hypertension    Hypothyroidism    Penicillin allergy 03/09/2022    Past Surgical History:  Procedure Laterality Date   ABDOMINAL HYSTERECTOMY     BACK SURGERY     BREAST SURGERY     benign  lumps removed   FOOT SURGERY     right foot  -- pin placed   POSTERIOR FUSION LUMBAR SPINE  06/11/2012    Family History  Problem Relation Age of Onset   Hypertension Mother       Social History   Socioeconomic History   Marital status: Widowed    Spouse name: Not on file   Number of children: Not on file   Years of education: Not on file   Highest education level: Not on file  Occupational History   Not on file  Tobacco Use   Smoking status: Never   Smokeless tobacco: Never  Substance and Sexual Activity  Alcohol use: No   Drug use: No   Sexual activity: Never    Birth control/protection: Surgical  Other Topics Concern   Not on file  Social History Narrative   Not on file   Social Determinants of Health   Financial Resource Strain: Not on file  Food Insecurity: Not on file  Transportation Needs: Not on file  Physical Activity: Not on file  Stress: Not on file  Social Connections: Not on file    No Known Allergies    Current Outpatient Medications:    alendronate (FOSAMAX) 70 MG tablet, Take 70 mg by mouth once a week., Disp: , Rfl: 3   amLODipine (NORVASC) 2.5 MG tablet, Take 2.5 mg by mouth 2 (two) times daily., Disp: , Rfl:     amoxicillin (AMOXIL) 250 MG capsule, Take 1 capsule (250 mg total) by mouth 3 (three) times daily. Combine with '500mg'$  for '750mg'$   three times daily with pantoprazole and rifabutin meds, Disp: 42 capsule, Rfl: 0   amoxicillin (AMOXIL) 500 MG capsule, Take 1 capsule by mouth three times daily with '250mg'$  cap for total dose of '750mg'$  three times daily., Disp: 42 capsule, Rfl: 0   cephALEXin (KEFLEX) 500 MG capsule, Take 1 capsule (500 mg total) by mouth 4 (four) times daily., Disp: 28 capsule, Rfl: 0   Cholecalciferol (VITAMIN D3 PO), Take by mouth., Disp: , Rfl:    cyanocobalamin 1000 MCG tablet, Take 1 tablet by mouth daily., Disp: , Rfl:    Garlic 1194 MG CAPS, Take 1 capsule by mouth daily., Disp: , Rfl:    levothyroxine (SYNTHROID) 25 MCG tablet, Take 0.5 tablets by mouth daily., Disp: , Rfl:    loratadine (CLARITIN) 10 MG tablet, Take 1 tablet by mouth as needed. (Patient not taking: Reported on 04/26/2022), Disp: , Rfl:    pantoprazole (PROTONIX) 40 MG tablet, Take 1 tablet (40 mg total) by mouth 2 (two) times daily. Take with amoxicillin and rifabutin, Disp: 28 tablet, Rfl: 0   rifabutin (MYCOBUTIN) 150 MG capsule, Take 2 capsules (300 mg total) by mouth daily. Combine this with '750mg'$  amoxicillin three times daily and pantoprazole twice daily., Disp: 28 capsule, Rfl: 0   vitamin C (ASCORBIC ACID) 250 MG tablet, Take 250 mg by mouth daily. gummies, Disp: , Rfl:    Zinc 50 MG CAPS, Take 1 capsule by mouth daily., Disp: , Rfl:    Review of Systems  Constitutional:  Negative for chills and fever.  HENT:  Negative for congestion and sore throat.   Eyes:  Negative for photophobia.  Respiratory:  Negative for cough, shortness of breath and wheezing.   Cardiovascular:  Negative for chest pain, palpitations and leg swelling.  Gastrointestinal:  Negative for abdominal pain, blood in stool, constipation, diarrhea, nausea and vomiting.  Genitourinary:  Negative for dysuria, flank pain and hematuria.   Musculoskeletal:  Negative for back pain and myalgias.  Skin:  Negative for rash.  Neurological:  Negative for dizziness, weakness and headaches.  Hematological:  Does not bruise/bleed easily.  Psychiatric/Behavioral:  Negative for suicidal ideas.        Objective:   Physical Exam Constitutional:      General: [name] not in acute distress.    Appearance: [name] not diaphoretic.  HENT:     Head: Normocephalic and atraumatic.     Right Ear: External ear normal.     Left Ear: External ear normal.     Nose: Nose normal.     Mouth/Throat:  Pharynx: No oropharyngeal exudate.  Eyes:     General: No scleral icterus.       Right eye: No discharge.        Left eye: No discharge.     Extraocular Movements: Extraocular movements intact.     Conjunctiva/sclera: Conjunctivae normal.  Cardiovascular:     Rate and Rhythm: Normal rate and regular rhythm.     Heart sounds:     No friction rub.  Pulmonary:     Effort: Pulmonary effort is normal. No respiratory distress.     Breath sounds: No wheezing.  Abdominal:     General: There is no distension.     Palpations: Abdomen is soft.  Musculoskeletal:        General: No tenderness. Normal range of motion.     Cervical back: Normal range of motion and neck supple.  Lymphadenopathy:     Cervical: No cervical adenopathy.  Skin:    General: Skin is warm and dry.     Coloration: Skin is not jaundiced or pale.     Findings: No erythema, lesion or rash.  Neurological:     General: No focal deficit present.     Mental Status: [name] alert and oriented to person, place, and time.     Coordination: Coordination normal.  Psychiatric:        Mood and Affect: Mood normal.        Behavior: Behavior normal.        Thought Content: Thought content normal.        Judgment: Judgment normal.           Assessment & Plan:  Fracture H. pylori infection:  She seems clinically to be cured now that we have given her a course of amoxicillin  rifabutin pantoprazole  Has been greater than 2 weeks since she completed treatment so we will check a stool antigen for H. pylori.  Penicillin allergy: she is no longer allergic to PCN  Vaccine counseling: offered flu vaccine which she refused

## 2022-06-24 ENCOUNTER — Other Ambulatory Visit: Payer: Medicare Other

## 2022-06-24 ENCOUNTER — Other Ambulatory Visit: Payer: Self-pay

## 2022-06-24 DIAGNOSIS — A048 Other specified bacterial intestinal infections: Secondary | ICD-10-CM

## 2022-06-24 NOTE — Addendum Note (Signed)
Addended by: Caffie Pinto on: 06/24/2022 09:40 AM   Modules accepted: Orders

## 2022-06-27 ENCOUNTER — Telehealth: Payer: Self-pay

## 2022-06-27 LAB — HELICOBACTER PYLORI  SPECIAL ANTIGEN
MICRO NUMBER:: 14050287
SPECIMEN QUALITY: ADEQUATE

## 2022-06-27 NOTE — Telephone Encounter (Signed)
Called and spoke with patient. Relayed message from provider verbatim, that her H pyloris is cured. Patient joyfully verbalized understanding.  Binnie Kand, RN

## 2022-06-27 NOTE — Telephone Encounter (Signed)
-----   Message from Truman Hayward, MD sent at 06/27/2022  4:32 PM EDT ----- Her H pyloris is cured!! She will be happy to know this ----- Message ----- From: Interface, [facility] Lab Results In Sent: 06/27/2022   3:05 PM EDT To: Truman Hayward, MD

## 2022-10-18 DIAGNOSIS — N8110 Cystocele, unspecified: Secondary | ICD-10-CM | POA: Diagnosis not present

## 2022-10-18 DIAGNOSIS — Z4689 Encounter for fitting and adjustment of other specified devices: Secondary | ICD-10-CM | POA: Diagnosis not present

## 2022-10-31 DIAGNOSIS — Z7185 Encounter for immunization safety counseling: Secondary | ICD-10-CM | POA: Diagnosis not present

## 2022-10-31 DIAGNOSIS — Z0000 Encounter for general adult medical examination without abnormal findings: Secondary | ICD-10-CM | POA: Diagnosis not present

## 2022-10-31 DIAGNOSIS — Z136 Encounter for screening for cardiovascular disorders: Secondary | ICD-10-CM | POA: Diagnosis not present

## 2022-10-31 DIAGNOSIS — N1831 Chronic kidney disease, stage 3a: Secondary | ICD-10-CM | POA: Diagnosis not present

## 2022-10-31 DIAGNOSIS — E039 Hypothyroidism, unspecified: Secondary | ICD-10-CM | POA: Diagnosis not present

## 2022-10-31 DIAGNOSIS — N8110 Cystocele, unspecified: Secondary | ICD-10-CM | POA: Diagnosis not present

## 2022-10-31 DIAGNOSIS — Z960 Presence of urogenital implants: Secondary | ICD-10-CM | POA: Diagnosis not present

## 2022-10-31 DIAGNOSIS — Z23 Encounter for immunization: Secondary | ICD-10-CM | POA: Diagnosis not present

## 2022-10-31 DIAGNOSIS — I10 Essential (primary) hypertension: Secondary | ICD-10-CM | POA: Diagnosis not present

## 2022-10-31 DIAGNOSIS — K219 Gastro-esophageal reflux disease without esophagitis: Secondary | ICD-10-CM | POA: Diagnosis not present

## 2023-01-30 DIAGNOSIS — Z4689 Encounter for fitting and adjustment of other specified devices: Secondary | ICD-10-CM | POA: Diagnosis not present

## 2023-03-17 DIAGNOSIS — Z1231 Encounter for screening mammogram for malignant neoplasm of breast: Secondary | ICD-10-CM | POA: Diagnosis not present

## 2023-03-17 DIAGNOSIS — M8588 Other specified disorders of bone density and structure, other site: Secondary | ICD-10-CM | POA: Diagnosis not present

## 2023-04-14 DIAGNOSIS — J040 Acute laryngitis: Secondary | ICD-10-CM | POA: Diagnosis not present

## 2023-04-14 DIAGNOSIS — E039 Hypothyroidism, unspecified: Secondary | ICD-10-CM | POA: Diagnosis not present

## 2023-04-14 DIAGNOSIS — R5382 Chronic fatigue, unspecified: Secondary | ICD-10-CM | POA: Diagnosis not present

## 2023-04-14 DIAGNOSIS — L989 Disorder of the skin and subcutaneous tissue, unspecified: Secondary | ICD-10-CM | POA: Diagnosis not present

## 2023-04-27 DIAGNOSIS — R310 Gross hematuria: Secondary | ICD-10-CM | POA: Diagnosis not present

## 2023-05-10 DIAGNOSIS — H903 Sensorineural hearing loss, bilateral: Secondary | ICD-10-CM | POA: Diagnosis not present

## 2023-05-10 DIAGNOSIS — R221 Localized swelling, mass and lump, neck: Secondary | ICD-10-CM | POA: Diagnosis not present

## 2023-06-02 DIAGNOSIS — H903 Sensorineural hearing loss, bilateral: Secondary | ICD-10-CM | POA: Diagnosis not present

## 2023-06-08 DIAGNOSIS — N8110 Cystocele, unspecified: Secondary | ICD-10-CM | POA: Diagnosis not present

## 2023-07-06 DIAGNOSIS — N8110 Cystocele, unspecified: Secondary | ICD-10-CM | POA: Diagnosis not present

## 2023-07-24 DIAGNOSIS — N8110 Cystocele, unspecified: Secondary | ICD-10-CM | POA: Diagnosis not present

## 2023-11-24 DIAGNOSIS — N8110 Cystocele, unspecified: Secondary | ICD-10-CM | POA: Diagnosis not present

## 2023-11-24 DIAGNOSIS — N816 Rectocele: Secondary | ICD-10-CM | POA: Diagnosis not present

## 2023-11-24 DIAGNOSIS — Z4689 Encounter for fitting and adjustment of other specified devices: Secondary | ICD-10-CM | POA: Diagnosis not present

## 2023-11-24 DIAGNOSIS — N958 Other specified menopausal and perimenopausal disorders: Secondary | ICD-10-CM | POA: Diagnosis not present

## 2023-12-07 DIAGNOSIS — M810 Age-related osteoporosis without current pathological fracture: Secondary | ICD-10-CM | POA: Diagnosis not present

## 2023-12-07 DIAGNOSIS — Z008 Encounter for other general examination: Secondary | ICD-10-CM | POA: Diagnosis not present

## 2023-12-07 DIAGNOSIS — N1831 Chronic kidney disease, stage 3a: Secondary | ICD-10-CM | POA: Diagnosis not present

## 2023-12-07 DIAGNOSIS — I129 Hypertensive chronic kidney disease with stage 1 through stage 4 chronic kidney disease, or unspecified chronic kidney disease: Secondary | ICD-10-CM | POA: Diagnosis not present

## 2023-12-07 DIAGNOSIS — E039 Hypothyroidism, unspecified: Secondary | ICD-10-CM | POA: Diagnosis not present

## 2024-02-08 DIAGNOSIS — R072 Precordial pain: Secondary | ICD-10-CM | POA: Diagnosis not present

## 2024-02-08 DIAGNOSIS — Z6824 Body mass index (BMI) 24.0-24.9, adult: Secondary | ICD-10-CM | POA: Diagnosis not present

## 2024-02-08 DIAGNOSIS — R490 Dysphonia: Secondary | ICD-10-CM | POA: Diagnosis not present

## 2024-02-08 DIAGNOSIS — R634 Abnormal weight loss: Secondary | ICD-10-CM | POA: Diagnosis not present

## 2024-02-27 DIAGNOSIS — N8110 Cystocele, unspecified: Secondary | ICD-10-CM | POA: Diagnosis not present

## 2024-02-27 DIAGNOSIS — T8389XA Other specified complication of genitourinary prosthetic devices, implants and grafts, initial encounter: Secondary | ICD-10-CM | POA: Diagnosis not present

## 2024-02-27 DIAGNOSIS — N816 Rectocele: Secondary | ICD-10-CM | POA: Diagnosis not present

## 2024-02-27 DIAGNOSIS — N958 Other specified menopausal and perimenopausal disorders: Secondary | ICD-10-CM | POA: Diagnosis not present

## 2024-02-27 DIAGNOSIS — N898 Other specified noninflammatory disorders of vagina: Secondary | ICD-10-CM | POA: Diagnosis not present

## 2024-03-18 DIAGNOSIS — Z4689 Encounter for fitting and adjustment of other specified devices: Secondary | ICD-10-CM | POA: Diagnosis not present

## 2024-03-18 DIAGNOSIS — N958 Other specified menopausal and perimenopausal disorders: Secondary | ICD-10-CM | POA: Diagnosis not present

## 2024-03-18 DIAGNOSIS — R350 Frequency of micturition: Secondary | ICD-10-CM | POA: Diagnosis not present

## 2024-03-27 DIAGNOSIS — H52223 Regular astigmatism, bilateral: Secondary | ICD-10-CM | POA: Diagnosis not present

## 2024-03-27 DIAGNOSIS — H35031 Hypertensive retinopathy, right eye: Secondary | ICD-10-CM | POA: Diagnosis not present

## 2024-03-27 DIAGNOSIS — H524 Presbyopia: Secondary | ICD-10-CM | POA: Diagnosis not present

## 2024-03-27 DIAGNOSIS — H04123 Dry eye syndrome of bilateral lacrimal glands: Secondary | ICD-10-CM | POA: Diagnosis not present

## 2024-03-27 DIAGNOSIS — Z961 Presence of intraocular lens: Secondary | ICD-10-CM | POA: Diagnosis not present

## 2024-04-13 DIAGNOSIS — R079 Chest pain, unspecified: Secondary | ICD-10-CM | POA: Diagnosis not present

## 2024-04-13 DIAGNOSIS — Z880 Allergy status to penicillin: Secondary | ICD-10-CM | POA: Diagnosis not present

## 2024-04-13 DIAGNOSIS — Z981 Arthrodesis status: Secondary | ICD-10-CM | POA: Diagnosis not present

## 2024-04-13 DIAGNOSIS — R072 Precordial pain: Secondary | ICD-10-CM | POA: Diagnosis not present

## 2024-04-13 DIAGNOSIS — R9431 Abnormal electrocardiogram [ECG] [EKG]: Secondary | ICD-10-CM | POA: Diagnosis not present

## 2024-04-13 DIAGNOSIS — Z79890 Hormone replacement therapy (postmenopausal): Secondary | ICD-10-CM | POA: Diagnosis not present

## 2024-04-13 DIAGNOSIS — R12 Heartburn: Secondary | ICD-10-CM | POA: Diagnosis not present

## 2024-04-13 DIAGNOSIS — E049 Nontoxic goiter, unspecified: Secondary | ICD-10-CM | POA: Diagnosis not present

## 2024-04-13 DIAGNOSIS — R0789 Other chest pain: Secondary | ICD-10-CM | POA: Diagnosis not present

## 2024-04-13 DIAGNOSIS — K219 Gastro-esophageal reflux disease without esophagitis: Secondary | ICD-10-CM | POA: Diagnosis not present

## 2024-04-13 DIAGNOSIS — E039 Hypothyroidism, unspecified: Secondary | ICD-10-CM | POA: Diagnosis not present

## 2024-04-13 DIAGNOSIS — Z79899 Other long term (current) drug therapy: Secondary | ICD-10-CM | POA: Diagnosis not present

## 2024-04-13 DIAGNOSIS — I517 Cardiomegaly: Secondary | ICD-10-CM | POA: Diagnosis not present

## 2024-04-13 DIAGNOSIS — I10 Essential (primary) hypertension: Secondary | ICD-10-CM | POA: Diagnosis not present

## 2024-04-25 DIAGNOSIS — R079 Chest pain, unspecified: Secondary | ICD-10-CM | POA: Diagnosis not present

## 2024-04-25 DIAGNOSIS — E039 Hypothyroidism, unspecified: Secondary | ICD-10-CM | POA: Diagnosis not present

## 2024-04-25 DIAGNOSIS — I10 Essential (primary) hypertension: Secondary | ICD-10-CM | POA: Diagnosis not present

## 2024-04-25 DIAGNOSIS — R634 Abnormal weight loss: Secondary | ICD-10-CM | POA: Diagnosis not present

## 2024-04-25 DIAGNOSIS — Z6823 Body mass index (BMI) 23.0-23.9, adult: Secondary | ICD-10-CM | POA: Diagnosis not present

## 2024-06-25 DIAGNOSIS — Z6822 Body mass index (BMI) 22.0-22.9, adult: Secondary | ICD-10-CM | POA: Diagnosis not present

## 2024-06-25 DIAGNOSIS — R490 Dysphonia: Secondary | ICD-10-CM | POA: Diagnosis not present

## 2024-06-25 DIAGNOSIS — R634 Abnormal weight loss: Secondary | ICD-10-CM | POA: Diagnosis not present

## 2024-06-25 DIAGNOSIS — K5900 Constipation, unspecified: Secondary | ICD-10-CM | POA: Diagnosis not present

## 2024-06-25 DIAGNOSIS — E039 Hypothyroidism, unspecified: Secondary | ICD-10-CM | POA: Diagnosis not present

## 2024-06-25 DIAGNOSIS — I10 Essential (primary) hypertension: Secondary | ICD-10-CM | POA: Diagnosis not present

## 2024-07-01 DIAGNOSIS — N898 Other specified noninflammatory disorders of vagina: Secondary | ICD-10-CM | POA: Diagnosis not present

## 2024-07-01 DIAGNOSIS — T8389XA Other specified complication of genitourinary prosthetic devices, implants and grafts, initial encounter: Secondary | ICD-10-CM | POA: Diagnosis not present

## 2024-07-02 ENCOUNTER — Encounter (INDEPENDENT_AMBULATORY_CARE_PROVIDER_SITE_OTHER): Payer: Self-pay

## 2024-07-23 ENCOUNTER — Ambulatory Visit: Payer: Self-pay | Attending: Cardiology | Admitting: Cardiology

## 2024-07-23 ENCOUNTER — Encounter: Payer: Self-pay | Admitting: Cardiology

## 2024-07-23 VITALS — BP 146/77 | HR 82 | Ht 66.0 in | Wt 135.0 lb

## 2024-07-23 DIAGNOSIS — I350 Nonrheumatic aortic (valve) stenosis: Secondary | ICD-10-CM

## 2024-07-23 DIAGNOSIS — R079 Chest pain, unspecified: Secondary | ICD-10-CM | POA: Diagnosis not present

## 2024-07-23 MED ORDER — METOPROLOL TARTRATE 50 MG PO TABS: 50.0000 mg | ORAL_TABLET | ORAL | 0 refills | Status: DC

## 2024-07-23 NOTE — Patient Instructions (Signed)
 Medication Instructions:  The current medical regimen is effective;  continue present plan and medications.  *If you need a refill on your cardiac medications before your next appointment, please call your pharmacy*   Testing/Procedures: Your physician has requested that you have an echocardiogram. Echocardiography is a painless test that uses sound waves to create images of your heart. It provides your doctor with information about the size and shape of your heart and how well your heart's chambers and valves are working. This procedure takes approximately one hour. There are no restrictions for this procedure. Please do NOT wear cologne, perfume, aftershave, or lotions (deodorant is allowed). Please arrive 15 minutes prior to your appointment time.  Please note: We ask at that you not bring children with you during ultrasound (echo/ vascular) testing. Due to room size and safety concerns, children are not allowed in the ultrasound rooms during exams. Our front office staff cannot provide observation of children in our lobby area while testing is being conducted. An adult accompanying a patient to their appointment will only be allowed in the ultrasound room at the discretion of the ultrasound technician under special circumstances. We apologize for any inconvenience.    Your cardiac CT will be scheduled at:  [facility] [address]  [place], [place] [address]  Please enter the parking lot using the [address] entrance and use the FREE valet service at the patient drop-off area. Enter the building and check-in with registration on the main floor.  Please follow these instructions carefully (unless otherwise directed):  An IV will be required for this test and Nitroglycerin will be given.  Hold all erectile dysfunction medications at least 3 days (72 hrs) prior to test. (Ie viagra, cialis, sildenafil, tadalafil, etc)   On the Night Before the Test: Be  sure to Drink plenty of water. Do not consume any caffeinated/decaffeinated beverages or chocolate 12 hours prior to your test. Do not take any antihistamines 12 hours prior to your test.  On the Day of the Test Drink plenty of water until 1 hour prior to the test. Do not eat any food 1 hour prior to test. You may take your regular medications prior to the test.  Take metoprolol (Lopressor) two hours prior to test. If you take Furosemide/Hydrochlorothiazide /Spironolactone/Chlorthalidone, please HOLD on the morning of the test. Patients who wear a continuous glucose monitor MUST remove the device prior to scanning. FEMALES- please wear underwire-free bra if available, avoid dresses & tight clothing  After the Test: Drink plenty of water. After receiving IV contrast, you may experience a mild flushed feeling. This is normal. On occasion, you may experience a mild rash up to 24 hours after the test. This is not dangerous. If this occurs, you can take Benadryl 25 mg, Zyrtec, Claritin, or Allegra and increase your fluid intake. (Patients taking Tikosyn should avoid Benadryl, and may take Zyrtec, Claritin, or Allegra) If you experience trouble breathing, this can be serious. If it is severe call 911 IMMEDIATELY. If it is mild, please call our office.  We will call to schedule your test 2-4 weeks out understanding that some insurance companies will need an authorization prior to the service being performed.   For more information and frequently asked questions, please visit our website : [email or web address]  For non-scheduling related questions, please contact the cardiac imaging nurse navigator should you have any questions/concerns: Cardiac Imaging Nurse Navigators Direct Office Dial: [phone]   For scheduling needs, including cancellations and  rescheduling, please call Brittany, [phone].   Follow-Up: At [facility], you and your health needs are our  priority.  As part of our continuing mission to provide you with exceptional heart care, our providers are all part of one team.  This team includes your primary Cardiologist (physician) and Advanced Practice Providers or APPs (Physician Assistants and Nurse Practitioners) who all work together to provide you with the care you need, when you need it.  Your next appointment:   Follow up will be based on the results of the above testing.   We recommend signing up for the patient portal called MyChart.  Sign up information is provided on this After Visit Summary.  MyChart is used to connect with patients for Virtual Visits (Telemedicine).  Patients are able to view lab/test results, encounter notes, upcoming appointments, etc.  Non-urgent messages can be sent to your provider as well.   To learn more about what you can do with MyChart, go to [email or web address].

## 2024-07-23 NOTE — Progress Notes (Signed)
 Cardiology Office Note:  .   Date:  07/23/2024  ID:  GENIENE LIST, DOB 08/26/1946, MRN [id number] PCP: Nanci Senior, MD  [facility] Providers Cardiologist:  None     History of Present Illness: .   Sheila Boyd is a 78 y.o. female Discussed the use of AI scribe  History of Present Illness Sheila Boyd is a 78 year old female with aortic sclerosis who presents with chest pain. She was referred by her doctor for evaluation of chest pain.  She experiences intermittent chest pain located in the upper part of her chest, occurring both with exertion, such as when going up a hill, and at rest. There is no specific pattern to the pain.  On April 13, 2024, she visited the hospital where tests showed no heart attack, with normal troponin levels and a creatinine level of 0.95. Hemoglobin was 12.9, and TSH was 0.4. An x-ray indicated cardiomegaly and widening of the superior mediastinum.  She has a history of thyroid  problems and is currently taking Synthroid . She has not undergone surgery or radioactive iodine treatment for her thyroid .  She has a history of aortic sclerosis with no stenosis and a previously noted systolic murmur. No family history of heart problems. Originally from [place] and never smoked.      Studies Reviewed: SABRA   EKG Interpretation Date/Time:  Tuesday July 23 2024 15:02:07 EST Ventricular Rate:  82 PR Interval:  134 QRS Duration:  146 QT Interval:  426 QTC Calculation: 497 R Axis:   -38  Text Interpretation: Normal sinus rhythm Left axis deviation Right bundle branch block When compared with ECG of 27-May-2022 15:50, Right bundle branch block is new Confirmed by Jeffrie Anes ([id number]) on 07/23/2024 3:22:44 PM    Results LABS Creatinine: 0.95 (04/13/2024) Hemoglobin: 12.9 (04/13/2024) TSH: 0.4 (04/13/2024)  RADIOLOGY Chest X-ray: Cardiomegaly and widening of the superior mediastinum compatible with goiter  (04/13/2024)  DIAGNOSTIC EKG: Right bundle branch block (07/23/2024) Risk Assessment/Calculations:           Physical Exam:   VS:  BP (!) 146/77   Pulse 82   Ht 5' 6 (1.676 m)   Wt 135 lb (61.2 kg)   SpO2 96%   BMI 21.79 kg/m    Wt Readings from Last 3 Encounters:  07/23/24 135 lb (61.2 kg)  06/21/22 144 lb (65.3 kg)  05/04/22 144 lb (65.3 kg)    GEN: Well nourished, well developed in no acute distress NECK: No JVD; No carotid bruits CARDIAC: RRR, 2/6 SM, no rubs, no gallops RESPIRATORY:  Clear to auscultation without rales, wheezing or rhonchi  ABDOMEN: Soft, non-tender, non-distended EXTREMITIES:  No edema; No deformity   ASSESSMENT AND PLAN: .    Assessment and Plan Assessment & Plan Chest pain Intermittent chest pain in the upper chest, sometimes exertional. No myocardial infarction as troponin levels were normal. ER notes personally reviewed. - Ordered coronary CT scan to assess for coronary artery stenosis.  Aortic sclerosis with systolic murmur Systolic murmur previously noted. No stenosis reported. Echocardiogram needed to assess heart function and valve status. - Ordered echocardiogram to evaluate heart function and valve status.  Right bundle branch block No immediate concern as it is often benign and asymptomatic.  Cardiomegaly Noted on chest X-ray. Further evaluation needed to determine significance.  Goiter and hypothyroidism Goiter noted on chest X-ray. Hypothyroidism managed with Synthroid . No surgical intervention or radioactive iodine treatment. - Continue Synthroid  for hypothyroidism management. - Stable  Will follow-up with results of study  Signed, Oneil Parchment, MD

## 2024-07-29 DIAGNOSIS — N898 Other specified noninflammatory disorders of vagina: Secondary | ICD-10-CM | POA: Diagnosis not present

## 2024-07-29 DIAGNOSIS — N958 Other specified menopausal and perimenopausal disorders: Secondary | ICD-10-CM | POA: Diagnosis not present

## 2024-07-29 DIAGNOSIS — Z4689 Encounter for fitting and adjustment of other specified devices: Secondary | ICD-10-CM | POA: Diagnosis not present

## 2024-08-09 ENCOUNTER — Encounter (HOSPITAL_COMMUNITY): Payer: Self-pay

## 2024-08-13 ENCOUNTER — Telehealth (HOSPITAL_COMMUNITY): Payer: Self-pay | Admitting: Emergency Medicine

## 2024-08-13 ENCOUNTER — Other Ambulatory Visit (HOSPITAL_COMMUNITY): Payer: Self-pay | Admitting: *Deleted

## 2024-08-13 MED ORDER — METOPROLOL TARTRATE 50 MG PO TABS: 50.0000 mg | ORAL_TABLET | ORAL | 0 refills | Status: AC

## 2024-08-13 NOTE — Telephone Encounter (Signed)
 Reaching out to patient to offer assistance regarding upcoming cardiac imaging study; pt verbalizes understanding of appt date/time, parking situation and where to check in, pre-test NPO status and medications ordered, and verified current allergies; name and call back number provided for further questions should they arise Camie Shutter RN Navigator Cardiac Imaging [facility] [phone] office [phone] cell  Reminded patient to pick up metoprolol  from pharm

## 2024-08-14 ENCOUNTER — Ambulatory Visit (HOSPITAL_COMMUNITY)
Admission: RE | Admit: 2024-08-14 | Discharge: 2024-08-14 | Disposition: A | Source: Ambulatory Visit | Attending: Cardiology | Admitting: Cardiology

## 2024-08-14 DIAGNOSIS — I2510 Atherosclerotic heart disease of native coronary artery without angina pectoris: Secondary | ICD-10-CM | POA: Diagnosis not present

## 2024-08-14 DIAGNOSIS — I700 Atherosclerosis of aorta: Secondary | ICD-10-CM | POA: Diagnosis not present

## 2024-08-14 DIAGNOSIS — R079 Chest pain, unspecified: Secondary | ICD-10-CM | POA: Diagnosis not present

## 2024-08-14 MED ORDER — NITROGLYCERIN 0.4 MG SL SUBL
0.8000 mg | SUBLINGUAL_TABLET | Freq: Once | SUBLINGUAL | Status: AC
  Administered 2024-08-14: 0.8 mg via SUBLINGUAL

## 2024-08-14 MED ORDER — IOHEXOL 350 MG/ML SOLN
100.0000 mL | Freq: Once | INTRAVENOUS | Status: AC | PRN
  Administered 2024-08-14: 100 mL via INTRAVENOUS

## 2024-08-15 ENCOUNTER — Ambulatory Visit (HOSPITAL_COMMUNITY)
Admission: RE | Admit: 2024-08-15 | Discharge: 2024-08-15 | Disposition: A | Source: Ambulatory Visit | Attending: Cardiology

## 2024-08-15 ENCOUNTER — Other Ambulatory Visit: Payer: Self-pay | Admitting: Cardiology

## 2024-08-15 DIAGNOSIS — R931 Abnormal findings on diagnostic imaging of heart and coronary circulation: Secondary | ICD-10-CM

## 2024-08-18 ENCOUNTER — Ambulatory Visit: Payer: Self-pay | Admitting: Cardiology

## 2024-08-19 ENCOUNTER — Institutional Professional Consult (permissible substitution) (INDEPENDENT_AMBULATORY_CARE_PROVIDER_SITE_OTHER): Payer: Self-pay

## 2024-08-26 ENCOUNTER — Other Ambulatory Visit: Payer: Self-pay | Admitting: *Deleted

## 2024-08-26 ENCOUNTER — Telehealth: Payer: Self-pay | Admitting: Cardiology

## 2024-08-26 DIAGNOSIS — J9859 Other diseases of mediastinum, not elsewhere classified: Secondary | ICD-10-CM

## 2024-08-26 DIAGNOSIS — Z79899 Other long term (current) drug therapy: Secondary | ICD-10-CM

## 2024-08-26 DIAGNOSIS — R931 Abnormal findings on diagnostic imaging of heart and coronary circulation: Secondary | ICD-10-CM

## 2024-08-26 DIAGNOSIS — Z01812 Encounter for preprocedural laboratory examination: Secondary | ICD-10-CM

## 2024-08-26 MED ORDER — ASPIRIN 81 MG PO TBEC: 81.0000 mg | DELAYED_RELEASE_TABLET | Freq: Every day | ORAL | Status: AC

## 2024-08-26 MED ORDER — ROSUVASTATIN CALCIUM 10 MG PO TABS: 10.0000 mg | ORAL_TABLET | Freq: Every day | ORAL | 1 refills | Status: AC

## 2024-08-26 NOTE — Telephone Encounter (Signed)
Patient has been notified of results. See result note.  

## 2024-08-26 NOTE — Telephone Encounter (Signed)
 Slater with [facility] calling to report critical CT report.

## 2024-08-26 NOTE — Telephone Encounter (Signed)
 Received call transferred from operator and spoke with Slater from [facility].  [name] calling to report radiology read from recent CT scan--   IMPRESSION: Soft tissue mass in the mediastinum likely representing thoracic extension of thyroid  tissue. Completion enhanced CT of the chest is recommended for confirmation.  Dr Jeffrie aware of result and has made recommendations

## 2024-08-30 ENCOUNTER — Encounter (INDEPENDENT_AMBULATORY_CARE_PROVIDER_SITE_OTHER): Payer: Self-pay

## 2024-09-02 ENCOUNTER — Ambulatory Visit (HOSPITAL_COMMUNITY)
Admission: RE | Admit: 2024-09-02 | Discharge: 2024-09-02 | Disposition: A | Discharge status: Home / Self Care | Source: Ambulatory Visit | Attending: Cardiovascular Disease | Admitting: Cardiovascular Disease

## 2024-09-02 ENCOUNTER — Ambulatory Visit (HOSPITAL_COMMUNITY): Admission: RE | Admit: 2024-09-02 | Discharge: 2024-09-02 | Attending: Cardiology | Admitting: Cardiology

## 2024-09-02 DIAGNOSIS — J9859 Other diseases of mediastinum, not elsewhere classified: Secondary | ICD-10-CM | POA: Insufficient documentation

## 2024-09-02 DIAGNOSIS — I350 Nonrheumatic aortic (valve) stenosis: Secondary | ICD-10-CM | POA: Diagnosis present

## 2024-09-02 LAB — ECHOCARDIOGRAM COMPLETE
Area-P 1/2: 3.99 cm2
MV M vel: 4.44 m/s
MV Peak grad: 78.9 mmHg
S' Lateral: 2.8 cm

## 2024-09-02 MED ORDER — IOHEXOL 350 MG/ML SOLN
75.0000 mL | Freq: Once | INTRAVENOUS | Status: AC | PRN
  Administered 2024-09-02: 75 mL via INTRAVENOUS

## 2024-09-17 ENCOUNTER — Ambulatory Visit: Payer: Self-pay | Admitting: Cardiology
